# Patient Record
Sex: Male | Born: 1950 | Race: Black or African American | Hispanic: No | Smoking: Current every day smoker
Health system: Southern US, Community
[De-identification: ages and names within clinical notes are randomized; demographics above are authoritative.]

## PROBLEM LIST (undated history)

## (undated) DIAGNOSIS — I10 Essential (primary) hypertension: Secondary | ICD-10-CM

## (undated) DIAGNOSIS — K759 Inflammatory liver disease, unspecified: Secondary | ICD-10-CM

## (undated) DIAGNOSIS — M1990 Unspecified osteoarthritis, unspecified site: Secondary | ICD-10-CM

## (undated) DIAGNOSIS — E1140 Type 2 diabetes mellitus with diabetic neuropathy, unspecified: Secondary | ICD-10-CM

## (undated) DIAGNOSIS — N189 Chronic kidney disease, unspecified: Secondary | ICD-10-CM

## (undated) DIAGNOSIS — K861 Other chronic pancreatitis: Secondary | ICD-10-CM

## (undated) HISTORY — PX: FOOT AMPUTATION THROUGH METATARSAL: SHX644

## (undated) HISTORY — PX: CHOLECYSTECTOMY: SHX55

## (undated) HISTORY — PX: ABDOMINAL SURGERY: SHX537

## (undated) HISTORY — PX: PROSTATE BIOPSY: SHX241

---

## 1983-09-14 DIAGNOSIS — B182 Chronic viral hepatitis C: Secondary | ICD-10-CM

## 2000-03-23 ENCOUNTER — Emergency Department (HOSPITAL_COMMUNITY): Admission: EM | Admit: 2000-03-23 | Discharge: 2000-03-23 | Payer: Self-pay

## 2000-04-04 ENCOUNTER — Ambulatory Visit (HOSPITAL_COMMUNITY): Admission: RE | Admit: 2000-04-04 | Discharge: 2000-04-04 | Payer: Self-pay | Admitting: Family Medicine

## 2000-04-04 ENCOUNTER — Encounter: Payer: Self-pay | Admitting: Family Medicine

## 2001-02-27 ENCOUNTER — Emergency Department (HOSPITAL_COMMUNITY): Admission: EM | Admit: 2001-02-27 | Discharge: 2001-02-28 | Payer: Self-pay | Admitting: Emergency Medicine

## 2001-10-01 ENCOUNTER — Encounter: Payer: Self-pay | Admitting: Orthopedic Surgery

## 2001-10-01 ENCOUNTER — Ambulatory Visit (HOSPITAL_COMMUNITY): Admission: RE | Admit: 2001-10-01 | Discharge: 2001-10-01 | Payer: Self-pay | Admitting: Orthopedic Surgery

## 2001-11-01 ENCOUNTER — Emergency Department (HOSPITAL_COMMUNITY): Admission: EM | Admit: 2001-11-01 | Discharge: 2001-11-01 | Payer: Self-pay | Admitting: Emergency Medicine

## 2001-12-18 ENCOUNTER — Encounter: Payer: Self-pay | Admitting: Emergency Medicine

## 2001-12-18 ENCOUNTER — Inpatient Hospital Stay (HOSPITAL_COMMUNITY): Admission: EM | Admit: 2001-12-18 | Discharge: 2001-12-20 | Payer: Self-pay | Admitting: Emergency Medicine

## 2002-01-01 ENCOUNTER — Inpatient Hospital Stay (HOSPITAL_COMMUNITY): Admission: EM | Admit: 2002-01-01 | Discharge: 2002-01-05 | Payer: Self-pay | Admitting: *Deleted

## 2002-01-15 ENCOUNTER — Encounter: Admission: RE | Admit: 2002-01-15 | Discharge: 2002-04-15 | Payer: Self-pay | Admitting: *Deleted

## 2002-03-05 ENCOUNTER — Emergency Department (HOSPITAL_COMMUNITY): Admission: EM | Admit: 2002-03-05 | Discharge: 2002-03-05 | Payer: Self-pay | Admitting: Emergency Medicine

## 2002-03-06 ENCOUNTER — Inpatient Hospital Stay (HOSPITAL_COMMUNITY): Admission: EM | Admit: 2002-03-06 | Discharge: 2002-03-10 | Payer: Self-pay | Admitting: Emergency Medicine

## 2002-03-06 ENCOUNTER — Encounter (INDEPENDENT_AMBULATORY_CARE_PROVIDER_SITE_OTHER): Payer: Self-pay | Admitting: Specialist

## 2002-03-06 ENCOUNTER — Encounter: Payer: Self-pay | Admitting: Emergency Medicine

## 2002-03-07 ENCOUNTER — Encounter: Payer: Self-pay | Admitting: Internal Medicine

## 2002-04-23 ENCOUNTER — Inpatient Hospital Stay (HOSPITAL_COMMUNITY): Admission: EM | Admit: 2002-04-23 | Discharge: 2002-04-27 | Payer: Self-pay | Admitting: Emergency Medicine

## 2002-04-23 ENCOUNTER — Encounter: Payer: Self-pay | Admitting: Emergency Medicine

## 2002-04-24 ENCOUNTER — Encounter: Payer: Self-pay | Admitting: Internal Medicine

## 2002-05-14 ENCOUNTER — Emergency Department (HOSPITAL_COMMUNITY): Admission: EM | Admit: 2002-05-14 | Discharge: 2002-05-15 | Payer: Self-pay | Admitting: Emergency Medicine

## 2002-05-31 ENCOUNTER — Inpatient Hospital Stay (HOSPITAL_COMMUNITY): Admission: EM | Admit: 2002-05-31 | Discharge: 2002-06-02 | Payer: Self-pay | Admitting: Emergency Medicine

## 2002-05-31 ENCOUNTER — Encounter: Payer: Self-pay | Admitting: Emergency Medicine

## 2002-06-08 ENCOUNTER — Emergency Department (HOSPITAL_COMMUNITY): Admission: EM | Admit: 2002-06-08 | Discharge: 2002-06-08 | Payer: Self-pay | Admitting: Emergency Medicine

## 2002-06-24 ENCOUNTER — Emergency Department (HOSPITAL_COMMUNITY): Admission: EM | Admit: 2002-06-24 | Discharge: 2002-06-24 | Payer: Self-pay | Admitting: Emergency Medicine

## 2002-07-20 ENCOUNTER — Inpatient Hospital Stay (HOSPITAL_COMMUNITY): Admission: EM | Admit: 2002-07-20 | Discharge: 2002-07-23 | Payer: Self-pay | Admitting: Emergency Medicine

## 2002-07-20 ENCOUNTER — Encounter: Payer: Self-pay | Admitting: Emergency Medicine

## 2002-07-21 ENCOUNTER — Encounter: Payer: Self-pay | Admitting: Internal Medicine

## 2002-09-11 ENCOUNTER — Emergency Department (HOSPITAL_COMMUNITY): Admission: EM | Admit: 2002-09-11 | Discharge: 2002-09-11 | Payer: Self-pay | Admitting: Emergency Medicine

## 2002-09-13 DIAGNOSIS — Z8719 Personal history of other diseases of the digestive system: Secondary | ICD-10-CM | POA: Insufficient documentation

## 2003-06-28 ENCOUNTER — Ambulatory Visit (HOSPITAL_COMMUNITY): Admission: RE | Admit: 2003-06-28 | Discharge: 2003-06-28 | Payer: Self-pay | Admitting: *Deleted

## 2003-06-28 ENCOUNTER — Encounter (INDEPENDENT_AMBULATORY_CARE_PROVIDER_SITE_OTHER): Payer: Self-pay | Admitting: *Deleted

## 2003-12-17 ENCOUNTER — Emergency Department (HOSPITAL_COMMUNITY): Admission: EM | Admit: 2003-12-17 | Discharge: 2003-12-18 | Payer: Self-pay | Admitting: Emergency Medicine

## 2004-08-30 ENCOUNTER — Emergency Department (HOSPITAL_COMMUNITY): Admission: EM | Admit: 2004-08-30 | Discharge: 2004-08-30 | Payer: Self-pay | Admitting: Emergency Medicine

## 2004-11-16 ENCOUNTER — Emergency Department (HOSPITAL_COMMUNITY): Admission: EM | Admit: 2004-11-16 | Discharge: 2004-11-16 | Payer: Self-pay | Admitting: Emergency Medicine

## 2005-04-10 ENCOUNTER — Emergency Department (HOSPITAL_COMMUNITY): Admission: EM | Admit: 2005-04-10 | Discharge: 2005-04-10 | Payer: Self-pay | Admitting: Emergency Medicine

## 2005-04-12 ENCOUNTER — Emergency Department (HOSPITAL_COMMUNITY): Admission: EM | Admit: 2005-04-12 | Discharge: 2005-04-12 | Payer: Self-pay | Admitting: Family Medicine

## 2006-05-07 ENCOUNTER — Emergency Department (HOSPITAL_COMMUNITY): Admission: EM | Admit: 2006-05-07 | Discharge: 2006-05-07 | Payer: Self-pay | Admitting: Emergency Medicine

## 2006-05-09 ENCOUNTER — Emergency Department (HOSPITAL_COMMUNITY): Admission: EM | Admit: 2006-05-09 | Discharge: 2006-05-09 | Payer: Self-pay | Admitting: Emergency Medicine

## 2006-05-13 ENCOUNTER — Inpatient Hospital Stay (HOSPITAL_COMMUNITY): Admission: EM | Admit: 2006-05-13 | Discharge: 2006-05-25 | Payer: Self-pay | Admitting: Emergency Medicine

## 2006-05-14 ENCOUNTER — Encounter: Payer: Self-pay | Admitting: Vascular Surgery

## 2006-06-30 ENCOUNTER — Ambulatory Visit: Payer: Self-pay | Admitting: Internal Medicine

## 2006-07-14 ENCOUNTER — Ambulatory Visit: Payer: Self-pay | Admitting: Internal Medicine

## 2006-09-20 ENCOUNTER — Ambulatory Visit: Payer: Self-pay | Admitting: Internal Medicine

## 2006-12-20 ENCOUNTER — Ambulatory Visit: Payer: Self-pay | Admitting: Internal Medicine

## 2006-12-30 ENCOUNTER — Ambulatory Visit: Payer: Self-pay | Admitting: Internal Medicine

## 2007-04-27 ENCOUNTER — Ambulatory Visit: Payer: Self-pay | Admitting: Family Medicine

## 2007-05-01 DIAGNOSIS — I10 Essential (primary) hypertension: Secondary | ICD-10-CM | POA: Insufficient documentation

## 2007-05-01 DIAGNOSIS — E119 Type 2 diabetes mellitus without complications: Secondary | ICD-10-CM

## 2007-06-23 ENCOUNTER — Ambulatory Visit: Payer: Self-pay | Admitting: Internal Medicine

## 2007-06-23 DIAGNOSIS — L0201 Cutaneous abscess of face: Secondary | ICD-10-CM | POA: Insufficient documentation

## 2007-06-23 DIAGNOSIS — J309 Allergic rhinitis, unspecified: Secondary | ICD-10-CM | POA: Insufficient documentation

## 2007-06-23 DIAGNOSIS — L03211 Cellulitis of face: Secondary | ICD-10-CM

## 2007-06-23 DIAGNOSIS — J019 Acute sinusitis, unspecified: Secondary | ICD-10-CM

## 2007-06-23 LAB — CONVERTED CEMR LAB: Blood Glucose, Fingerstick: 93

## 2007-07-31 IMAGING — CR DG FOOT COMPLETE 3+V*L*
3 series · 3 of 3 positions shown · non-contrast
Comparison: 05/13/06.

CLINICAL DATA: Foot ulcer ? post amputation of 5th toe.
LEFT FOOT ? 3 VIEW:

[view not recorded (1 of 3)]
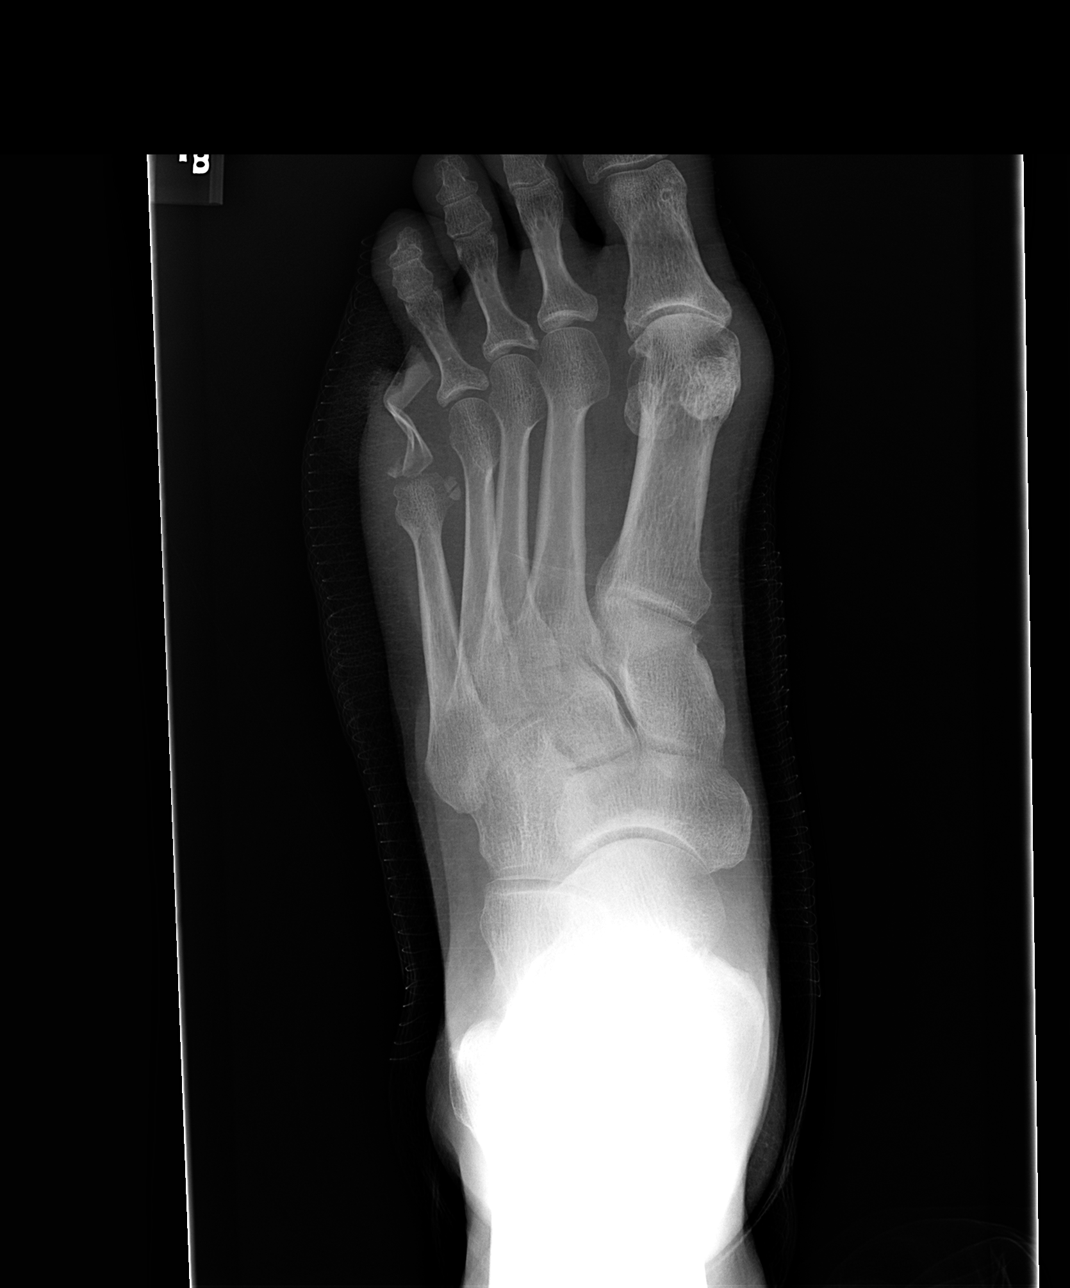

[view not recorded (2 of 3)]
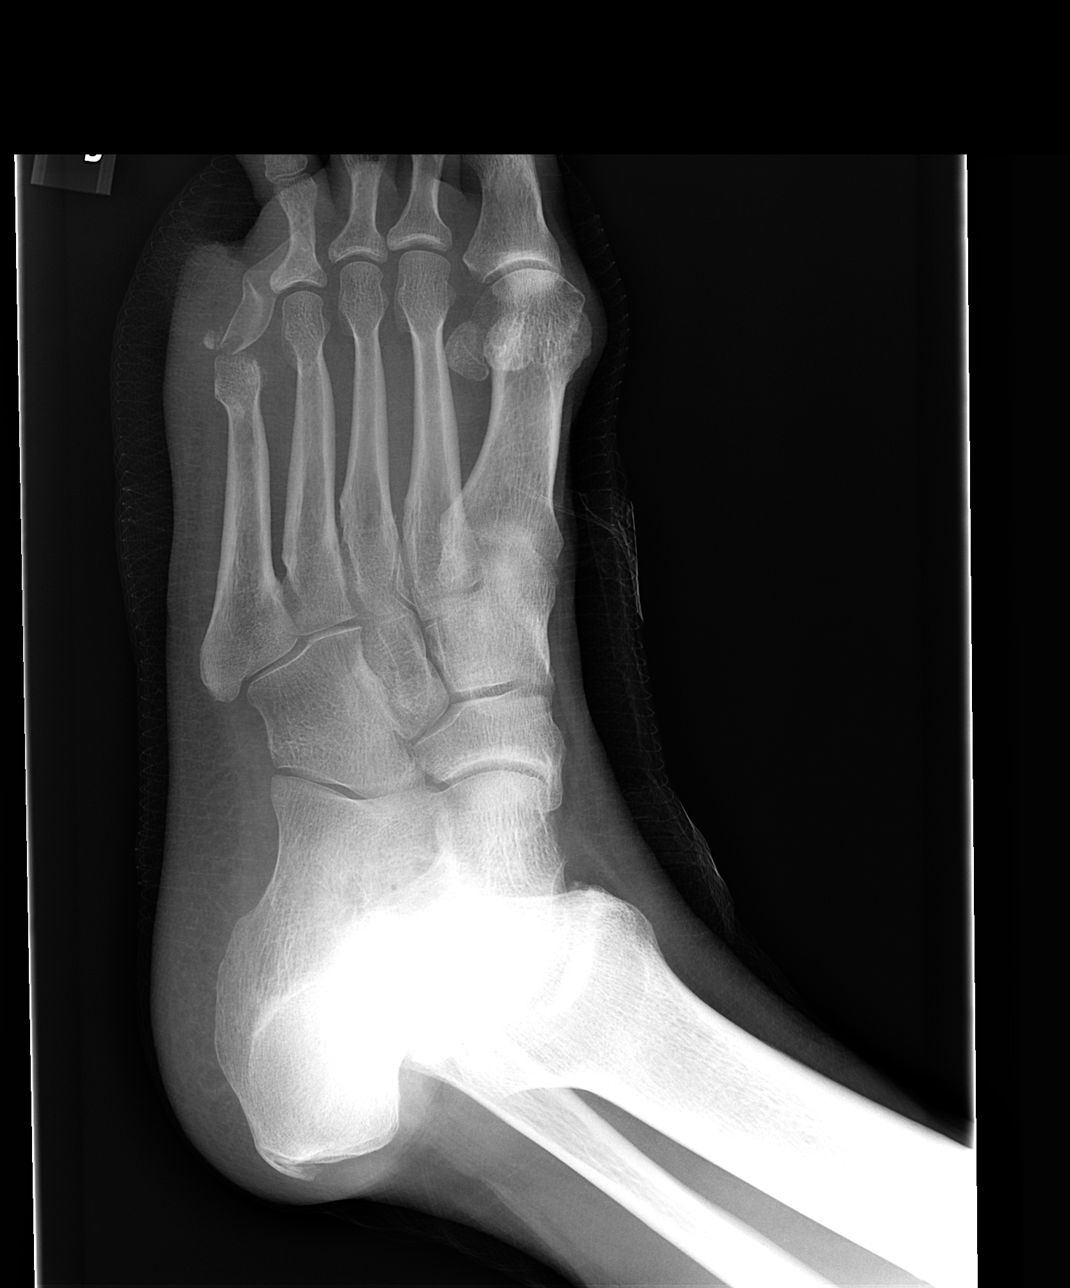

[view not recorded (3 of 3)]
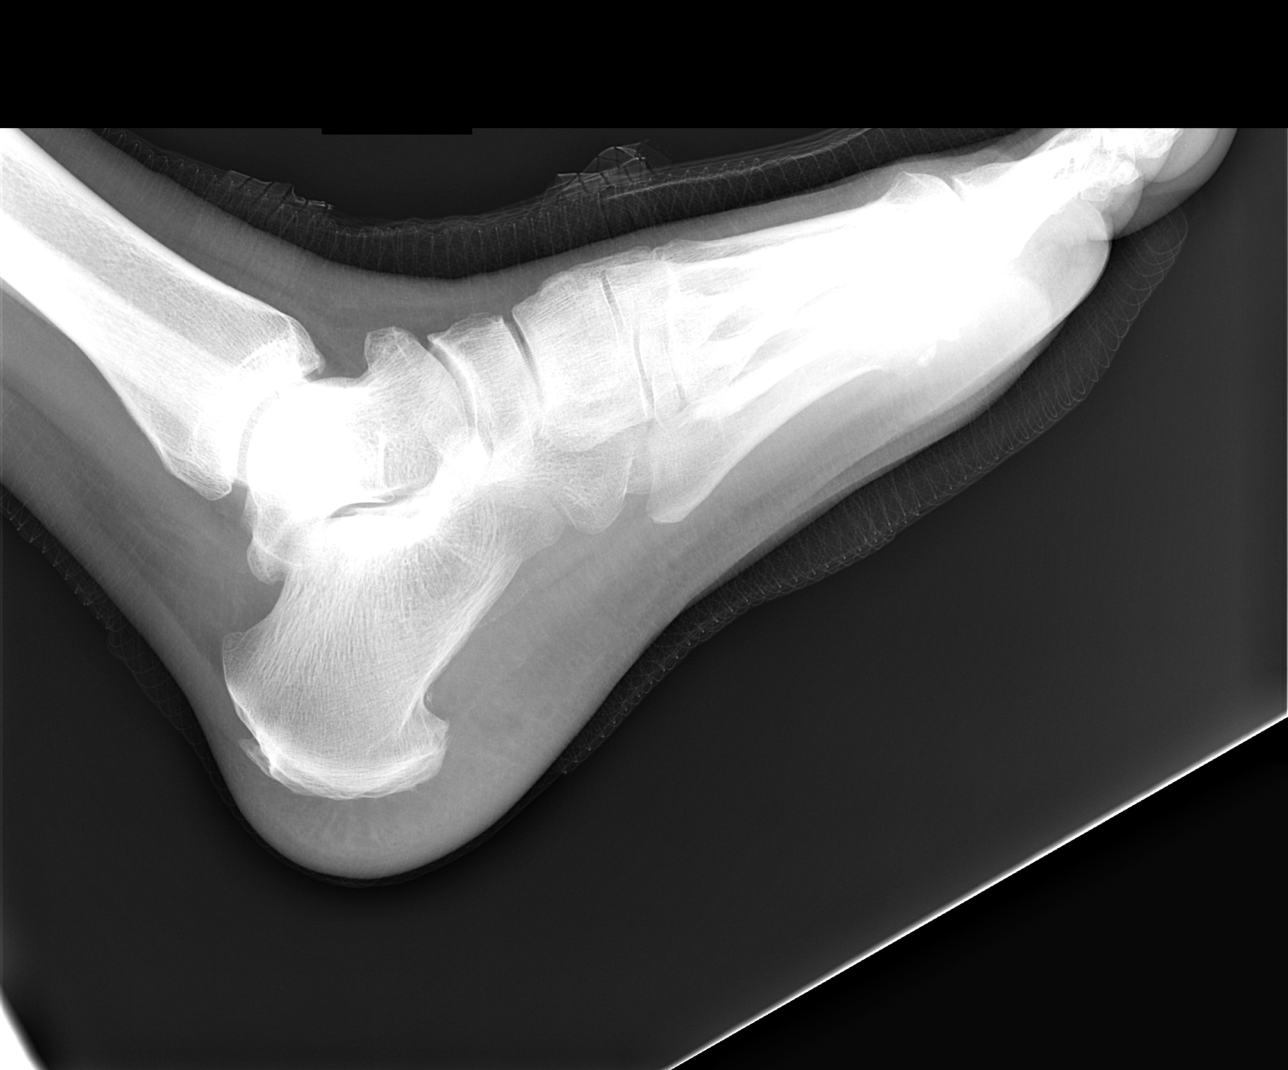

[3 of 3 positions shown; findings below may reference images not displayed]

FINDINGS: Status post amputation of the 5th toe.  There is a surgical drain at the site of the amputation.  No gas in the soft tissues.  No bony abnormality.  It appears as if a small piece of the lateral base of the proximal phalanx is still present.  The remainder of the toe has been amputated.
IMPRESSION: Status post left 5th toe amputation ? it appears as if a small piece of the lateral base of the phalanx is still in place.

## 2007-10-20 ENCOUNTER — Ambulatory Visit: Payer: Self-pay | Admitting: Internal Medicine

## 2007-10-20 DIAGNOSIS — B353 Tinea pedis: Secondary | ICD-10-CM

## 2007-10-26 ENCOUNTER — Telehealth (INDEPENDENT_AMBULATORY_CARE_PROVIDER_SITE_OTHER): Payer: Self-pay | Admitting: Internal Medicine

## 2007-11-06 ENCOUNTER — Ambulatory Visit: Payer: Self-pay | Admitting: *Deleted

## 2007-12-05 ENCOUNTER — Ambulatory Visit: Payer: Self-pay | Admitting: Internal Medicine

## 2007-12-05 DIAGNOSIS — L0293 Carbuncle, unspecified: Secondary | ICD-10-CM

## 2007-12-05 DIAGNOSIS — L0292 Furuncle, unspecified: Secondary | ICD-10-CM | POA: Insufficient documentation

## 2007-12-05 LAB — CONVERTED CEMR LAB
ALT: 36 units/L (ref 0–53)
AST: 39 units/L — ABNORMAL HIGH (ref 0–37)
Albumin: 5 g/dL (ref 3.5–5.2)
Alkaline Phosphatase: 91 units/L (ref 39–117)
BUN: 11 mg/dL (ref 6–23)
Basophils Absolute: 0 10*3/uL (ref 0.0–0.1)
Basophils Relative: 0 % (ref 0–1)
Bilirubin Urine: NEGATIVE
Blood in Urine, dipstick: NEGATIVE
CO2: 28 meq/L (ref 19–32)
Calcium: 9.9 mg/dL (ref 8.4–10.5)
Chloride: 103 meq/L (ref 96–112)
Creatinine, Ser: 1.16 mg/dL (ref 0.40–1.50)
Eosinophils Absolute: 0 10*3/uL (ref 0.0–0.7)
Eosinophils Relative: 1 % (ref 0–5)
Glucose, Bld: 82 mg/dL (ref 70–99)
Glucose, Urine, Semiquant: NEGATIVE
HCT: 43.8 % (ref 39.0–52.0)
Hemoglobin: 14.5 g/dL (ref 13.0–17.0)
Lymphocytes Relative: 38 % (ref 12–46)
Lymphs Abs: 2.3 10*3/uL (ref 0.7–4.0)
MCHC: 33.1 g/dL (ref 30.0–36.0)
MCV: 88.5 fL (ref 78.0–100.0)
Monocytes Absolute: 0.5 10*3/uL (ref 0.1–1.0)
Monocytes Relative: 8 % (ref 3–12)
Neutro Abs: 3.3 10*3/uL (ref 1.7–7.7)
Neutrophils Relative %: 54 % (ref 43–77)
Platelets: 289 10*3/uL (ref 150–400)
Potassium: 4 meq/L (ref 3.5–5.3)
Protein, U semiquant: 30
RBC: 4.95 M/uL (ref 4.22–5.81)
RDW: 13.3 % (ref 11.5–15.5)
Sodium: 144 meq/L (ref 135–145)
Total Bilirubin: 0.5 mg/dL (ref 0.3–1.2)
Total Protein: 8.1 g/dL (ref 6.0–8.3)
WBC: 6.1 10*3/uL (ref 4.0–10.5)

## 2007-12-07 ENCOUNTER — Ambulatory Visit: Payer: Self-pay | Admitting: Internal Medicine

## 2007-12-07 LAB — CONVERTED CEMR LAB: Blood Glucose, Fingerstick: 107

## 2007-12-14 ENCOUNTER — Telehealth (INDEPENDENT_AMBULATORY_CARE_PROVIDER_SITE_OTHER): Payer: Self-pay | Admitting: Internal Medicine

## 2008-07-11 ENCOUNTER — Ambulatory Visit: Payer: Self-pay | Admitting: Internal Medicine

## 2008-07-11 LAB — CONVERTED CEMR LAB: Blood Glucose, Fingerstick: 141

## 2008-07-26 ENCOUNTER — Ambulatory Visit: Payer: Self-pay | Admitting: Internal Medicine

## 2008-07-26 LAB — CONVERTED CEMR LAB
Protein, U semiquant: NEGATIVE
Urobilinogen, UA: 0.2
WBC Urine, dipstick: NEGATIVE

## 2008-07-31 ENCOUNTER — Encounter (INDEPENDENT_AMBULATORY_CARE_PROVIDER_SITE_OTHER): Payer: Self-pay | Admitting: Internal Medicine

## 2008-07-31 LAB — CONVERTED CEMR LAB
ALT: 62 units/L — ABNORMAL HIGH (ref 0–53)
AST: 42 units/L — ABNORMAL HIGH (ref 0–37)
Albumin: 4.7 g/dL (ref 3.5–5.2)
Alkaline Phosphatase: 75 units/L (ref 39–117)
Basophils Absolute: 0 10*3/uL (ref 0.0–0.1)
Basophils Relative: 0 % (ref 0–1)
Eosinophils Absolute: 0.2 10*3/uL (ref 0.0–0.7)
Eosinophils Relative: 3 % (ref 0–5)
Glucose, Bld: 87 mg/dL (ref 70–99)
HCT: 42.8 % (ref 39.0–52.0)
LDL Cholesterol: 102 mg/dL — ABNORMAL HIGH (ref 0–99)
Lymphs Abs: 2.8 10*3/uL (ref 0.7–4.0)
MCHC: 32.5 g/dL (ref 30.0–36.0)
MCV: 89.7 fL (ref 78.0–100.0)
Neutrophils Relative %: 43 % (ref 43–77)
PSA: 0.59 ng/mL (ref 0.10–4.00)
Platelets: 208 10*3/uL (ref 150–400)
Potassium: 4.5 meq/L (ref 3.5–5.3)
RDW: 13.9 % (ref 11.5–15.5)
Sodium: 143 meq/L (ref 135–145)
Total Bilirubin: 0.6 mg/dL (ref 0.3–1.2)
Total Protein: 7.1 g/dL (ref 6.0–8.3)
Triglycerides: 44 mg/dL (ref <150)
VLDL: 9 mg/dL (ref 0–40)
WBC: 6.1 10*3/uL (ref 4.0–10.5)

## 2008-08-28 ENCOUNTER — Telehealth (INDEPENDENT_AMBULATORY_CARE_PROVIDER_SITE_OTHER): Payer: Self-pay | Admitting: Internal Medicine

## 2008-08-28 LAB — CONVERTED CEMR LAB
Hep B C IgM: NEGATIVE
Hep B S Ab: NEGATIVE
Hepatitis B Surface Ag: NEGATIVE

## 2008-09-26 ENCOUNTER — Ambulatory Visit: Payer: Self-pay | Admitting: Internal Medicine

## 2008-09-26 DIAGNOSIS — K861 Other chronic pancreatitis: Secondary | ICD-10-CM | POA: Insufficient documentation

## 2008-09-26 DIAGNOSIS — E785 Hyperlipidemia, unspecified: Secondary | ICD-10-CM | POA: Insufficient documentation

## 2008-09-26 DIAGNOSIS — R809 Proteinuria, unspecified: Secondary | ICD-10-CM

## 2008-09-26 LAB — CONVERTED CEMR LAB
Bilirubin Urine: NEGATIVE
Blood in Urine, dipstick: NEGATIVE
Ketones, urine, test strip: NEGATIVE
Urobilinogen, UA: 1
pH: 7

## 2008-09-27 ENCOUNTER — Encounter (INDEPENDENT_AMBULATORY_CARE_PROVIDER_SITE_OTHER): Payer: Self-pay | Admitting: Internal Medicine

## 2008-10-03 LAB — CONVERTED CEMR LAB

## 2008-10-07 ENCOUNTER — Ambulatory Visit: Payer: Self-pay | Admitting: Internal Medicine

## 2008-10-08 ENCOUNTER — Encounter (INDEPENDENT_AMBULATORY_CARE_PROVIDER_SITE_OTHER): Payer: Self-pay | Admitting: Internal Medicine

## 2008-10-08 LAB — CONVERTED CEMR LAB: HCV Quantitative: 82400 intl units/mL — ABNORMAL HIGH (ref <43)

## 2008-10-15 ENCOUNTER — Encounter (INDEPENDENT_AMBULATORY_CARE_PROVIDER_SITE_OTHER): Payer: Self-pay | Admitting: Internal Medicine

## 2008-11-04 ENCOUNTER — Encounter (INDEPENDENT_AMBULATORY_CARE_PROVIDER_SITE_OTHER): Payer: Self-pay | Admitting: Internal Medicine

## 2008-11-29 ENCOUNTER — Encounter (INDEPENDENT_AMBULATORY_CARE_PROVIDER_SITE_OTHER): Payer: Self-pay | Admitting: Internal Medicine

## 2008-12-05 ENCOUNTER — Encounter (INDEPENDENT_AMBULATORY_CARE_PROVIDER_SITE_OTHER): Payer: Self-pay | Admitting: Internal Medicine

## 2008-12-05 ENCOUNTER — Ambulatory Visit: Payer: Self-pay | Admitting: Gastroenterology

## 2008-12-16 ENCOUNTER — Telehealth (INDEPENDENT_AMBULATORY_CARE_PROVIDER_SITE_OTHER): Payer: Self-pay | Admitting: Family Medicine

## 2008-12-20 ENCOUNTER — Encounter (INDEPENDENT_AMBULATORY_CARE_PROVIDER_SITE_OTHER): Payer: Self-pay | Admitting: Internal Medicine

## 2009-04-16 ENCOUNTER — Ambulatory Visit: Payer: Self-pay | Admitting: Internal Medicine

## 2009-04-16 DIAGNOSIS — R5381 Other malaise: Secondary | ICD-10-CM

## 2009-04-16 DIAGNOSIS — R5383 Other fatigue: Secondary | ICD-10-CM

## 2009-04-16 LAB — CONVERTED CEMR LAB
ALT: 70 units/L — ABNORMAL HIGH (ref 0–53)
AST: 47 units/L — ABNORMAL HIGH (ref 0–37)
Albumin: 4.8 g/dL (ref 3.5–5.2)
Alkaline Phosphatase: 73 units/L (ref 39–117)
Basophils Absolute: 0 10*3/uL (ref 0.0–0.1)
Hgb A1c MFr Bld: 6.5 %
Lymphocytes Relative: 42 % (ref 12–46)
Lymphs Abs: 2.6 10*3/uL (ref 0.7–4.0)
Neutro Abs: 3 10*3/uL (ref 1.7–7.7)
Platelets: 242 10*3/uL (ref 150–400)
Potassium: 4.1 meq/L (ref 3.5–5.3)
RDW: 14.5 % (ref 11.5–15.5)
Sodium: 145 meq/L (ref 135–145)
Total Protein: 7.7 g/dL (ref 6.0–8.3)
WBC: 6.1 10*3/uL (ref 4.0–10.5)

## 2009-04-30 ENCOUNTER — Ambulatory Visit: Payer: Self-pay | Admitting: Internal Medicine

## 2009-05-05 LAB — CONVERTED CEMR LAB
LDL Cholesterol: 123 mg/dL — ABNORMAL HIGH (ref 0–99)
Triglycerides: 56 mg/dL (ref <150)
VLDL: 11 mg/dL (ref 0–40)

## 2009-05-20 ENCOUNTER — Encounter (INDEPENDENT_AMBULATORY_CARE_PROVIDER_SITE_OTHER): Payer: Self-pay | Admitting: Internal Medicine

## 2009-05-21 ENCOUNTER — Ambulatory Visit (HOSPITAL_COMMUNITY): Admission: RE | Admit: 2009-05-21 | Discharge: 2009-05-21 | Payer: Self-pay | Admitting: Internal Medicine

## 2009-07-09 ENCOUNTER — Encounter (INDEPENDENT_AMBULATORY_CARE_PROVIDER_SITE_OTHER): Payer: Self-pay | Admitting: Internal Medicine

## 2009-08-19 ENCOUNTER — Ambulatory Visit: Payer: Self-pay | Admitting: Internal Medicine

## 2009-08-19 LAB — CONVERTED CEMR LAB
Blood Glucose, Fingerstick: 202
Hgb A1c MFr Bld: 8.1 %

## 2009-09-04 ENCOUNTER — Ambulatory Visit: Payer: Self-pay | Admitting: Internal Medicine

## 2009-09-04 ENCOUNTER — Telehealth (INDEPENDENT_AMBULATORY_CARE_PROVIDER_SITE_OTHER): Payer: Self-pay | Admitting: Internal Medicine

## 2009-09-16 DIAGNOSIS — E875 Hyperkalemia: Secondary | ICD-10-CM

## 2009-09-16 LAB — CONVERTED CEMR LAB
Calcium: 9.8 mg/dL (ref 8.4–10.5)
Glucose, Bld: 172 mg/dL — ABNORMAL HIGH (ref 70–99)
Sodium: 145 meq/L (ref 135–145)

## 2009-09-19 ENCOUNTER — Ambulatory Visit: Payer: Self-pay | Admitting: Internal Medicine

## 2009-09-19 LAB — CONVERTED CEMR LAB
BUN: 9 mg/dL
CO2: 24 meq/L
Calcium: 9.6 mg/dL
Chloride: 105 meq/L
Creatinine, Ser: 1.08 mg/dL
Glucose, Bld: 101 mg/dL — ABNORMAL HIGH
Potassium: 4 meq/L
Sodium: 144 meq/L

## 2009-10-01 ENCOUNTER — Ambulatory Visit: Payer: Self-pay | Admitting: Internal Medicine

## 2009-10-14 ENCOUNTER — Ambulatory Visit: Payer: Self-pay | Admitting: Internal Medicine

## 2009-10-14 DIAGNOSIS — M722 Plantar fascial fibromatosis: Secondary | ICD-10-CM | POA: Insufficient documentation

## 2009-10-14 LAB — CONVERTED CEMR LAB
ALT: 108 units/L — ABNORMAL HIGH (ref 0–53)
Blood Glucose, Fingerstick: 150
CO2: 29 meq/L (ref 19–32)
Calcium: 9.6 mg/dL (ref 8.4–10.5)
Chloride: 105 meq/L (ref 96–112)
Cholesterol: 165 mg/dL (ref 0–200)
Creatinine, Ser: 1.1 mg/dL (ref 0.40–1.50)
Total Protein: 7.5 g/dL (ref 6.0–8.3)

## 2009-11-11 ENCOUNTER — Ambulatory Visit: Payer: Self-pay | Admitting: Internal Medicine

## 2009-11-12 ENCOUNTER — Ambulatory Visit: Payer: Self-pay | Admitting: Internal Medicine

## 2009-11-12 DIAGNOSIS — Z8639 Personal history of other endocrine, nutritional and metabolic disease: Secondary | ICD-10-CM

## 2009-11-12 DIAGNOSIS — Z862 Personal history of diseases of the blood and blood-forming organs and certain disorders involving the immune mechanism: Secondary | ICD-10-CM

## 2009-11-12 LAB — CONVERTED CEMR LAB
Alkaline Phosphatase: 61 units/L (ref 39–117)
Indirect Bilirubin: 0.2 mg/dL (ref 0.0–0.9)
Total Bilirubin: 0.3 mg/dL (ref 0.3–1.2)

## 2009-11-26 ENCOUNTER — Encounter: Admission: RE | Admit: 2009-11-26 | Discharge: 2010-01-14 | Payer: Self-pay | Admitting: Internal Medicine

## 2009-12-01 ENCOUNTER — Encounter (INDEPENDENT_AMBULATORY_CARE_PROVIDER_SITE_OTHER): Payer: Self-pay | Admitting: Internal Medicine

## 2009-12-04 ENCOUNTER — Encounter (INDEPENDENT_AMBULATORY_CARE_PROVIDER_SITE_OTHER): Payer: Self-pay | Admitting: Internal Medicine

## 2009-12-17 ENCOUNTER — Ambulatory Visit: Payer: Self-pay | Admitting: Internal Medicine

## 2009-12-17 LAB — CONVERTED CEMR LAB
ALT: 68 units/L — ABNORMAL HIGH (ref 0–53)
Cholesterol: 186 mg/dL (ref 0–200)
Hgb A1c MFr Bld: 7.7 % — ABNORMAL HIGH (ref 4.6–6.1)
PSA: 0.55 ng/mL (ref 0.10–4.00)
Total CHOL/HDL Ratio: 4.7
Total Protein: 7.5 g/dL (ref 6.0–8.3)
Triglycerides: 85 mg/dL (ref <150)
VLDL: 17 mg/dL (ref 0–40)

## 2009-12-19 ENCOUNTER — Ambulatory Visit: Payer: Self-pay | Admitting: Internal Medicine

## 2009-12-19 LAB — CONVERTED CEMR LAB
Bilirubin Urine: NEGATIVE
Specific Gravity, Urine: >=1.03
Urobilinogen, UA: 1
WBC Urine, dipstick: NEGATIVE

## 2009-12-22 ENCOUNTER — Ambulatory Visit: Payer: Self-pay | Admitting: Internal Medicine

## 2009-12-27 ENCOUNTER — Encounter (INDEPENDENT_AMBULATORY_CARE_PROVIDER_SITE_OTHER): Payer: Self-pay | Admitting: Internal Medicine

## 2010-01-14 ENCOUNTER — Encounter (INDEPENDENT_AMBULATORY_CARE_PROVIDER_SITE_OTHER): Payer: Self-pay | Admitting: Internal Medicine

## 2010-02-16 ENCOUNTER — Ambulatory Visit: Payer: Self-pay | Admitting: Internal Medicine

## 2010-02-16 LAB — CONVERTED CEMR LAB
ALT: 80 units/L — ABNORMAL HIGH (ref 0–53)
Albumin: 4.9 g/dL (ref 3.5–5.2)
Alkaline Phosphatase: 68 units/L (ref 39–117)
Cholesterol: 149 mg/dL (ref 0–200)
HDL: 46 mg/dL (ref >39)
LDL Cholesterol: 86 mg/dL (ref 0–99)
Total Protein: 7.7 g/dL (ref 6.0–8.3)
Triglycerides: 85 mg/dL (ref <150)

## 2010-03-03 ENCOUNTER — Encounter (INDEPENDENT_AMBULATORY_CARE_PROVIDER_SITE_OTHER): Payer: Self-pay | Admitting: Internal Medicine

## 2010-04-21 ENCOUNTER — Ambulatory Visit: Payer: Self-pay | Admitting: Internal Medicine

## 2010-04-21 DIAGNOSIS — K029 Dental caries, unspecified: Secondary | ICD-10-CM | POA: Insufficient documentation

## 2010-07-22 ENCOUNTER — Ambulatory Visit: Payer: Self-pay | Admitting: Internal Medicine

## 2010-07-22 LAB — CONVERTED CEMR LAB
ALT: 96 units/L — ABNORMAL HIGH (ref 0–53)
AST: 67 units/L — ABNORMAL HIGH (ref 0–37)
Alkaline Phosphatase: 70 units/L (ref 39–117)
Blood Glucose, Fingerstick: 165
Indirect Bilirubin: 0.4 mg/dL (ref 0.0–0.9)
Total Protein: 7.6 g/dL (ref 6.0–8.3)

## 2010-08-10 ENCOUNTER — Telehealth (INDEPENDENT_AMBULATORY_CARE_PROVIDER_SITE_OTHER): Payer: Self-pay | Admitting: Internal Medicine

## 2010-08-21 ENCOUNTER — Ambulatory Visit: Payer: Self-pay | Admitting: Internal Medicine

## 2010-08-21 ENCOUNTER — Encounter (INDEPENDENT_AMBULATORY_CARE_PROVIDER_SITE_OTHER): Payer: Self-pay | Admitting: Internal Medicine

## 2010-08-21 DIAGNOSIS — R51 Headache: Secondary | ICD-10-CM

## 2010-08-21 DIAGNOSIS — R519 Headache, unspecified: Secondary | ICD-10-CM | POA: Insufficient documentation

## 2010-08-21 LAB — CONVERTED CEMR LAB: Sed Rate: 1 mm/hr (ref 0–16)

## 2010-09-08 ENCOUNTER — Encounter
Admission: RE | Admit: 2010-09-08 | Discharge: 2010-09-14 | Payer: Self-pay | Source: Home / Self Care | Attending: Internal Medicine | Admitting: Internal Medicine

## 2010-09-14 ENCOUNTER — Encounter (INDEPENDENT_AMBULATORY_CARE_PROVIDER_SITE_OTHER): Payer: Self-pay | Admitting: Internal Medicine

## 2010-09-15 ENCOUNTER — Encounter (INDEPENDENT_AMBULATORY_CARE_PROVIDER_SITE_OTHER): Payer: Self-pay | Admitting: Internal Medicine

## 2010-10-05 ENCOUNTER — Encounter: Payer: Self-pay | Admitting: Internal Medicine

## 2010-10-13 NOTE — Progress Notes (Signed)
  Phone Note Outgoing Call   Summary of Call: Please check with pt. as to whether he is taking his Metformin and whether he is eating better for his diabetes.  His sugars are not well controlled.  Let him know his liver enzymes are okay Initial call taken by: Julieanne Manson MD,  August 10, 2010 9:44 AM  Follow-up for Phone Call        pt says he is taking metformin... he says he did run out since we was closed for the holidays but he is going to pick up the med now.... pt says he is not eating right.... i advised pt to eat well and execrise Follow-up by: [place] Shannon,  August 10, 2010 11:11 AM  Additional Follow-up for Phone Call Additional follow up Details #1::        Has appt. 12/9--will address again then. Additional Follow-up by: Julieanne Manson MD,  August 11, 2010 3:25 PM

## 2010-10-13 NOTE — Letter (Signed)
Summary: *HSN Results Follow up  [facility]  [address] [place], [place] [address]   Phone: [phone]  Fax: [phone]      12/27/2009   Colton Garcia [address] [place], [place]  [address]   Dear  Mr. Colton Garcia,                            ____S.Drinkard,FNP   ____D. Gore,FNP       ____B. McPherson,MD   ____V. Rankins,MD    ___X_E. Hearl Heikes,MD    ____N. Daphine Deutscher, FNP  ____D. Reche Dixon, MD    ____K. Philipp Deputy, MD    ____Other     This letter is to inform you that your recent test(s):  _______Pap Smear    ___X____Lab Test     _______X-ray    ___X____ is within acceptable limits  _______ requires a medication change  _______ requires a follow-up lab visit  _______ requires a follow-up visit with your provider   Comments:  STD evaluation negative (everything was okay)       _________________________________________________________ If you have any questions, please contact our office                     Sincerely,  Julieanne Manson MD [facility]

## 2010-10-13 NOTE — Letter (Signed)
Summary: REFERRAL //PHYSICAL THERAPY  REFERRAL //PHYSICAL THERAPY   Imported By: Arta Bruce 01/08/2010 12:54:58  _____________________________________________________________________  External Attachment:    Type:   Image     Comment:   External Document

## 2010-10-13 NOTE — Progress Notes (Signed)
Summary: Office Visit//DEPRESSION SCREENING  Office Visit//DEPRESSION SCREENING   Imported By: Arta Bruce 02/20/2010 11:57:41  _____________________________________________________________________  External Attachment:    Type:   Image     Comment:   External Document

## 2010-10-13 NOTE — Letter (Signed)
Summary: [place] [facility]   Imported By: Arta Bruce 04/29/2010 15:34:16  _____________________________________________________________________  External Attachment:    Type:   Image     Comment:   External Document

## 2010-10-13 NOTE — Miscellaneous (Signed)
Summary: INITIAL SUMMARY  INITIAL SUMMARY   Imported By: Arta Bruce 02/10/2010 15:30:46  _____________________________________________________________________  External Attachment:    Type:   Image     Comment:   External Document

## 2010-10-13 NOTE — Letter (Signed)
Summary: Lipid Letter  [facility]  [address] [place], [place] [address]   Phone: [phone]  Fax: [phone]    03/03/2010  Colton Garcia [address] [place], [place]  [address]  Dear Colton Garcia:  We have carefully reviewed your last lipid profile from 02/16/2010 and the results are noted below with a summary of recommendations for lipid management.    Cholesterol:       149     Goal: <200   HDL "good" Cholesterol:   46     Goal: >45   LDL "bad" Cholesterol:   86     Goal: <70   Triglycerides:       85     Goal: <150    Your cholesterol is much better and other than just a bit high LDL, at goal.  Your liver enzymes are about the same--I say you stay on the low dose Crestor.  Please call in to make a follow up liver enzyme recheck in 3 months    TLC Diet (Therapeutic Lifestyle Change): Saturated Fats & Transfatty acids should be kept < 7% of total calories ***Reduce Saturated Fats Polyunstaurated Fat can be up to 10% of total calories Monounsaturated Fat Fat can be up to 20% of total calories Total Fat should be no greater than 25-35% of total calories Carbohydrates should be 50-60% of total calories Protein should be approximately 15% of total calories Fiber should be at least 20-30 grams a day ***Increased fiber may help lower LDL Total Cholesterol should be < 200mg /day Consider adding plant stanol/sterols to diet (example: Benacol spread) ***A higher intake of unsaturated fat may reduce Triglycerides and Increase HDL    Adjunctive Measures (may lower LIPIDS and reduce risk of Heart Attack) include: Aerobic Exercise (20-30 minutes 3-4 times a week) Limit Alcohol Consumption Weight Reduction Aspirin 75-81 mg a day by mouth (if not allergic or contraindicated) Dietary Fiber 20-30 grams a day by mouth     Current Medications: 1)    Metformin Hcl 500 Mg  Tabs (Metformin hcl) .... Take 1 tablet by mouth daily 2)    Crestor 5 Mg Tabs (Rosuvastatin  calcium) .Marland Kitchen.. 1 tab by mouth daily 3)    Lisinopril 20 Mg Tabs (Lisinopril) .Marland Kitchen.. 1 tab by mouth daily 4)    Hydrochlorothiazide 12.5 Mg Caps (Hydrochlorothiazide) .Marland Kitchen.. 1 cap by mouth daily in morning 5)    Bilateral Heel Cups  .... 728.71 6)    Cephalexin 500 Mg Caps (Cephalexin) .Marland Kitchen.. 1 cap by mouth 4 times daily for 5 days  If you have any questions, please call. We appreciate being able to work with you.   Sincerely,    [facility] Julieanne Manson MD

## 2010-10-13 NOTE — Miscellaneous (Signed)
Summary: Rehab Report/DISCHARGE SUMMARY  Rehab Report/DISCHARGE SUMMARY   Imported By: Arta Bruce 01/22/2010 11:54:02  _____________________________________________________________________  External Attachment:    Type:   Image     Comment:   External Document

## 2010-10-13 NOTE — Miscellaneous (Signed)
Summary: //RENEWAL SUMMARY  //RENEWAL SUMMARY   Imported By: Arta Bruce 02/23/2010 16:14:49  _____________________________________________________________________  External Attachment:    Type:   Image     Comment:   External Document

## 2010-10-13 NOTE — Assessment & Plan Note (Signed)
Summary: DM/ HTN/ FU WITH MULBERRY MD IN 4 MOS//GK   Vital Signs:  Patient profile:   60 year old male Weight:      176 pounds BMI:     25.34 Temp:     98.2 degrees F oral Pulse rate:   64 / minute Pulse rhythm:   regular Resp:     18 per minute BP sitting:   114 / 68  (left arm) Cuff size:   regular  Vitals Entered By: Hale Drone CMA (August 21, 2010 9:10 AM) CC: f/u on DM and HTN.  Is Patient Diabetic? Yes Pain Assessment Patient in pain? no      CBG Result 142 CBG Device ID A Non Fasting  Does patient need assistance? Functional Status Self care Ambulation Normal   CC:  f/u on DM and HTN. Marland Kitchen  History of Present Illness: 1.  DM:  Pt. states he was not watching his diet and was not exercising prior to last visit with A1C up to 8.5%.  He also went without Metformin for a period.  MOved from place he was staying, but now in another person's home who does not eat well also.  They will fix what he wants as long as he purchases the food.    2.  Headaches:  Top of head.  Pt. states was just lying around smoking a lot.  With onset of headaches, he decreased his smoking and became more active--has been working on this for a month, but no change to headaches.  Feels like pressure on top of his head.  Started in about this time in  November.  Occurring 2-3 times weekly.  No aura.  No definite photophobia or phonophobia.  No associated nausea or vomiting.  Sometimes so bad, feels like eyes will pop out.  Sometimes very short lived--comes on quickly and severely and if he sits and rests, will resolve in a few minutes.  Can last a couple of hours--if lies down and sleeps, resolved upon awakening.  Takes Aleve 2 tabs before lying down.  Not clear how much the med resolves the issue.  Under a lot of stress currently.  Mainly financial concerns.  Current Medications (verified): 1)  Metformin Hcl 500 Mg  Tabs (Metformin Hcl) .... Take 1 Tablet By Mouth Daily 2)  Crestor 5 Mg Tabs  (Rosuvastatin Calcium) .Marland Kitchen.. 1 Tab By Mouth Daily 3)  Lisinopril 20 Mg Tabs (Lisinopril) .Marland Kitchen.. 1 Tab By Mouth Daily 4)  Bilateral Heel Cups .... 728.71  Allergies (verified): No Known Drug Allergies  Physical Exam  General:  NAD Head:  NT over scalp. No thickening over temporal arteries Tender over left posterior neck musculature--states this also hurts with the headache Eyes:  No corneal or conjunctival inflammation noted. EOMI. Perrla. Funduscopic exam benign, without hemorrhages, exudates or papilledema. Vision grossly normal. Ears:  External ear exam shows no significant lesions or deformities.  Otoscopic examination reveals clear canals, tympanic membranes are intact bilaterally without bulging, retraction, inflammation or discharge. Hearing is grossly normal bilaterally. Nose:  External nasal examination shows no deformity or inflammation. Nasal mucosa are pink and moist without lesions or exudates. Mouth:  pharynx pink and moist.   Neck:  No deformities, masses, but tender over left paraspinous musculature Lungs:  Normal respiratory effort, chest expands symmetrically. Lungs are clear to auscultation, no crackles or wheezes. Heart:  Normal rate and regular rhythm. S1 and S2 normal without gallop, murmur, click, rub or other extra sounds. Neurologic:  alert &  oriented X3 and cranial nerves II-XII intact.    Diabetes Management Exam:    Foot Exam (with socks and/or shoes not present):       Sensory-Monofilament:          Left foot: normal          Right foot: normal   Impression & Recommendations:  Problem # 1:  HEADACHE (ICD-784.0)  Suspect tension related-check ESR, however  Orders: T-Sed Rate (Automated) (09381-82993) Physical Therapy Referral (PT)  Problem # 2:  LIVER FUNCTION TESTS, ABNORMAL, HX OF (ICD-V12.2) About the same on Crestor--recheck in another  months  Problem # 3:  DIABETES MELLITUS (ICD-250.00) Pt. to get back to better lifestyle--sounds like he is  trying. His updated medication list for this problem includes:    Metformin Hcl 500 Mg Tabs (Metformin hcl) .Marland Kitchen... Take 1 tablet by mouth daily    Lisinopril 20 Mg Tabs (Lisinopril) .Marland Kitchen... 1 tab by mouth daily  Complete Medication List: 1)  Metformin Hcl 500 Mg Tabs (Metformin hcl) .... Take 1 tablet by mouth daily 2)  Crestor 5 Mg Tabs (Rosuvastatin calcium) .Marland Kitchen.. 1 tab by mouth daily 3)  Lisinopril 20 Mg Tabs (Lisinopril) .Marland Kitchen.. 1 tab by mouth daily 4)  Bilateral Heel Cups  .... 728.71  Patient Instructions: 1)  labs for liver profile in 3 months 2)  Follow up with Dr. Delrae Alfred in 4 months --DM   Orders Added: 1)  T-Sed Rate (Automated) [71696-78938] 2)  Est. Patient Level IV [10175] 3)  Physical Therapy Referral [PT]     Diabetic Foot Exam Foot Inspection Is there a history of a foot ulcer?              No Is there a foot ulcer now?              No Can the patient see the bottom of their feet?          Yes Are the shoes appropriate in style and fit?          Yes Is there swelling or an abnormal foot shape?          No Are the toenails long?                No Are the toenails thick?                No Are the toenails ingrown?              Yes Is there heavy callous build-up?              No Is there pain in the calf muscle (Intermittent claudication) when walking?    NoIs there a claw toe deformity?              No Is there elevated skin temperature?            No Is there limited ankle dorsiflexion?            No Is there foot or ankle muscle weakness?            No  Diabetic Foot Care Education    10-g (5.07) Semmes-Weinstein Monofilament Test Performed by: Hale Drone CMA          Right Foot          Left Foot Visual Inspection               Test Control      normal  normal Site 1         normal         normal Site 2         normal         normal Site 3         normal         abnormal Site 4         normal         normal Site 5         normal          normal Site 6         normal         normal Site 7         normal         normal Site 8         normal         normal Site 9         normal         normal Site 10         normal         normal  Impression      normal         normal

## 2010-10-13 NOTE — Assessment & Plan Note (Signed)
Summary: FOLLOW UP WITH DR MULBERRY IN 4 MONTHS DM//GK   Vital Signs:  Patient profile:   60 year old male Weight:      172 pounds Temp:     98.0 degrees F Pulse rate:   56 / minute Pulse rhythm:   regular Resp:     16 per minute BP sitting:   110 / 72  (left arm) Cuff size:   regular  Vitals Entered By: Vesta Mixer CMA (April 21, 2010 9:11 AM) CC: 4 month f/u dm Is Patient Diabetic? Yes Pain Assessment Patient in pain? no      CBG Result 217  Does patient need assistance? Ambulation Normal   CC:  4 month f/u dm.  History of Present Illness: 1.  Hypertension:  Pt. not taking HCTZ--thought this was discontinued.  Has been good about taking Lisinopril regularly.    2.  DM:  Sugars fluctuating--can be 125 and other times above 250.  Has noted pressure type headache on days when sugar is high.  Can relate eating more carbs on the days he has higher blood sugars.  Has  been out of Metformin for 5 days.  Pharmacy stated they did not realize he needed that filled.   Not exercising much secondary to the heat.  3.   Hypercholesterolemia:  states roommate cooks a lot of fried foods and also carbs.  4.  Dental referral:  has not heard back--states over 1 year.  Not having an active problem currently.  Not particularly interested in dental hygiene care or cleaning.  Allergies (verified): No Known Drug Allergies  Physical Exam  Lungs:  Normal respiratory effort, chest expands symmetrically. Lungs are clear to auscultation, no crackles or wheezes. Heart:  Normal rate and regular rhythm. S1 and S2 normal without gallop, murmur, click, rub or other extra sounds.  Radial pulses normal and equal Abdomen:  Bowel sounds positive,abdomen soft and non-tender without masses, organomegaly or hernias noted. Extremities:  No clubbing, cyanosis, edema, or deformity noted with normal full range of motion of all joints.     Impression & Recommendations:  Problem # 1:  EPIDERMOID CYST,  INFECTED (ICD-706.2) Resolved infection  Problem # 2:  DYSLIPIDEMIA (ICD-272.4) To continue to work on diet. His updated medication list for this problem includes:    Crestor 5 Mg Tabs (Rosuvastatin calcium) .Marland Kitchen... 1 tab by mouth daily  Problem # 3:  HYPERTENSION (ICD-401.9) BP fine off HCTZ--stop The following medications were removed from the medication list:    Hydrochlorothiazide 12.5 Mg Caps (Hydrochlorothiazide) .Marland Kitchen... 1 cap by mouth daily in morning His updated medication list for this problem includes:    Lisinopril 20 Mg Tabs (Lisinopril) .Marland Kitchen... 1 tab by mouth daily  Problem # 4:  DIABETES MELLITUS (ICD-250.00) Control not as good. Pt. to speak with roommate and work on better diet. To increase exercise His updated medication list for this problem includes:    Metformin Hcl 500 Mg Tabs (Metformin hcl) .Marland Kitchen... Take 1 tablet by mouth daily    Lisinopril 20 Mg Tabs (Lisinopril) .Marland Kitchen... 1 tab by mouth daily  Orders: Capillary Blood Glucose/CBG (35573)  Problem # 5:  DENTAL CARIES (ICD-521.00) info given on free dental clinic and [employer] dental hygiene school services.  Complete Medication List: 1)  Metformin Hcl 500 Mg Tabs (Metformin hcl) .... Take 1 tablet by mouth daily 2)  Crestor 5 Mg Tabs (Rosuvastatin calcium) .Marland Kitchen.. 1 tab by mouth daily 3)  Lisinopril 20 Mg Tabs (Lisinopril) .Marland KitchenMarland KitchenMarland Kitchen 1  tab by mouth daily 4)  Bilateral Heel Cups  .... 728.71  Patient Instructions: 1)  Watch for free dental clinic beginning of November. 2)  Lab appt--nonfasting in 3 months for liver profile and A1C 3)  Follow up with Dr. Delrae Alfred in 4 months --DM, htn 4)  Stop HCTZ Prescriptions: CRESTOR 5 MG TABS (ROSUVASTATIN CALCIUM) 1 tab by mouth daily  #30 x 8   Entered and Authorized by:   Julieanne Manson MD   Signed by:   Julieanne Manson MD on 04/21/2010   Method used:   Faxed to ...       [facility] (retail)       [address] [place], [place]   [address]       Ph: [phone]       Fax: [phone]   RxID:   [id number]   Laboratory Results   Blood Tests     HGBA1C: 8.0%   (Normal Range: Non-Diabetic - 3-6%   Control Diabetic - 6-8%) CBG Random:: 217mg /dL     Appended Document: FOLLOW UP WITH DR MULBERRY IN 4 MONTHS DM//GK    Clinical Lists Changes  Observations: Added new observation of DIAB EYE EX: Retasure:  No retinopathy (12/22/2009 14:28)

## 2010-10-13 NOTE — Assessment & Plan Note (Signed)
Summary: RIGHT FOOT PAIN/SPOT ON FOOT//KT   Vital Signs:  Patient profile:   60 year old male Weight:      172 pounds Temp:     97.4 degrees F Pulse rate:   57 / minute Pulse rhythm:   regular Resp:     18 per minute Cuff size:   regular  Vitals Entered By: Vesta Mixer CMA (October 14, 2009 9:16 AM) CC: rt heel pain and spot on foot also Is Patient Diabetic? Yes Pain Assessment Patient in pain? yes     Location: rt heel Intensity: 2 CBG Result 150  Does patient need assistance? Ambulation Normal   CC:  rt heel pain and spot on foot also.  History of Present Illness: 1.  Right heel pain for 1 month:  No history of injury.  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  Wearing work boots generally, sometimes high top tennis shoes.  Does note some pain when first up in morning, but better after a couple of steps.  Has put gel soles in work boots.  Taking Aleve--2 tabs in morning only.    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 instructions To call if no improvement in 1-1 1/2 months  Complete Medication List: 1)  Metformin Hcl 500 Mg Tabs (Metformin hcl) .... Take 1 tablet by mouth daily 2)  Clotrimazole 1 % Ext Crea (Clotrimazole) .... Apply two times a day to affected areas 3)  Nasacort Aq 55 Mcg/act Aers (Triamcinolone acetonide(nasal)) .... 2 sprays each nostril daily 4)  Doxycycline Hyclate 100 Mg Tabs (Doxycycline hyclate) .Marland Kitchen.. 1 tab by mouth two times a day for 10 days 5)  Crestor 5 Mg Tabs (Rosuvastatin calcium) .Marland Kitchen.. 1 tab by mouth daily 6)  Lisinopril 20 Mg Tabs (Lisinopril) .Marland Kitchen.. 1 tab by mouth daily 7)  Hydrochlorothiazide 12.5 Mg Caps (Hydrochlorothiazide) .Marland Kitchen.. 1 cap by mouth daily in morning 8)  Bilateral Heel Cups  ....  728.71  Other Orders: Capillary Blood Glucose/CBG ([address])  Patient Instructions: 1)  liver profile in 1 month--lab visit-nonfasting for elevated liver enzymes 2)  FLP and liver profile, A1C, urine microalbumin, PSA at least 2 days before CPE in April--should already be scheduled.   3)  Aleve 2 tabs by mouth two times a day with food for 2 weeks 4)  Stretch your feet two times a day for 10-20 minutes 5)  Heel cups for both feet--use in all shoes 6)  Get thick cushioned slippers--do not walk around in stocking or bare feet. Prescriptions: BILATERAL HEEL CUPS 728.71  #2 x 0   Entered and Authorized by:   Julieanne Manson MD   Signed by:   Julieanne Manson MD on 10/14/2009   Method used:   Print then Give to Patient   RxID:   [id number]
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
## 2010-10-13 NOTE — Assessment & Plan Note (Signed)
Summary: right heel pain /tmm   Vital Signs:  Patient profile:   60 year old male Weight:      174.7 pounds BMI:     26.27 Temp:     98.0 degrees F oral Pulse rate:   68 / minute Pulse rhythm:   regular Resp:     19 per minute BP sitting:   162 / 85  (left arm) Cuff size:   regular  Vitals Entered By: Geanie Cooley  (November 12, 2009 10:30 AM) CC: Pt  is here for pain in his right heel. Pt states it hurts when he puts pressure on it and lift up. Pt states it hurts to walk on. Pt states they didnt have the bilaterial heel cups support, they gave him the closest thing to them , and pt states they have been working for about 2weeks, but the pain has started back up.  Is Patient Diabetic? Yes Did you bring your meter with you today? No Pain Assessment Patient in pain? yes     Location: foot Intensity: 7 Type: sharp Onset of pain  Constant CBG Result 96  Does patient need assistance? Functional Status Self care Ambulation Normal   CC:  Pt  is here for pain in his right heel. Pt states it hurts when he puts pressure on it and lift up. Pt states it hurts to walk on. Pt states they didnt have the bilaterial heel cups support, they gave him the closest thing to them , and pt states they have been working for about 2weeks, and but the pain has started back up. Marland Kitchen  History of Present Illness: 1.  Plantar fasciitis:  States when first got heel cups, was doing well.  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  Has not really been doing stretching exercises more than once a day and only for 5 minutes.  Is taking Aleve.  Calfs are now hurting as walking around on toes.  Denies walking barefoot.  2.  Elevated liver enzymes: Has Hep C, but started on Crestor recently with good results.  Liver enzymes last checked were a bit higher, but better yesterday on recheck.   Allergies (verified): No Known Drug Allergies  Physical Exam  Extremities:  Point tenderness  over plantar aspect of right heel.  No gastroc or soleus tenderness.  No achilles tendon tenderness  Diabetes Management Exam:    Foot Exam (with socks and/or shoes not present):       Sensory-Monofilament:          Left foot: normal          Right foot: normal   Impression & Recommendations:  Problem # 1:  LIVER FUNCTION TESTS, ABNORMAL, HX OF (ICD-V12.2) Improved--follow closely on Crestor Has CPE in April  Problem # 2:  PLANTAR FASCIITIS, RIGHT (ICD-728.71)  Orders: Physical Therapy Referral (PT)  Complete Medication List: 1)  Metformin Hcl 500 Mg Tabs (Metformin hcl) .... Take 1 tablet by mouth daily 2)  Clotrimazole 1 % Ext Crea (Clotrimazole) .... Apply two times a day to affected areas 3)  Nasacort Aq 55 Mcg/act Aers (Triamcinolone acetonide(nasal)) .... 2 sprays each nostril daily 4)  Doxycycline Hyclate 100 Mg Tabs (Doxycycline hyclate) .Marland Kitchen.. 1 tab by mouth two times a day for 10 days 5)  Crestor 5 Mg Tabs (Rosuvastatin calcium) .Marland Kitchen.. 1 tab by mouth daily 6)  Lisinopril 20 Mg Tabs (Lisinopril) .Marland Kitchen.. 1 tab by mouth daily 7)  Hydrochlorothiazide  12.5 Mg Caps (Hydrochlorothiazide) .Marland Kitchen.. 1 cap by mouth daily in morning 8)  Bilateral Heel Cups  .... 728.71  Other Orders: Capillary Blood Glucose/CBG (16109)   Vital Signs:  Patient profile:   60 year old male Weight:      174.7 pounds BMI:     26.27 Temp:     98.0 degrees F oral Pulse rate:   68 / minute Pulse rhythm:   regular Resp:     19 per minute BP sitting:   162 / 85  (left arm) Cuff size:   regular  Vitals Entered By: Geanie Cooley  (November 12, 2009 10:30 AM)    Last LDL:                                                 99 (10/01/2009 10:33:00 PM)      Diabetic Foot Exam Foot Inspection Is there a history of a foot ulcer?              No Is there a foot ulcer now?              No Can the patient see the bottom of their feet?          Yes Is there swelling or an abnormal foot shape?          No Are the  toenails long?                No Are the toenails thick?                Yes Are the toenails ingrown?              No Is there heavy callous build-up?              No Is there a claw toe deformity?                          No Is there elevated skin temperature?            No Is there limited ankle dorsiflexion?            No Is there foot or ankle muscle weakness?            No Do you have pain in calf while walking?           Yes         10-g (5.07) Semmes-Weinstein Monofilament Test           Right Foot          Left Foot Visual Inspection               Test Control      normal         normal Site 1         normal         normal Site 2         normal         normal Site 3         normal         normal Site 4         normal         normal Site 5         normal  normal Site 6         normal         normal Site 7         normal         normal Site 8         normal         normal Site 9         normal         normal Site 10         normal         normal  Impression      normal         normal   Laboratory Results   Blood Tests   Date/Time Received: November 12, 2009 10:52 AM   HGBA1C: 7.5%   (Normal Range: Non-Diabetic - 3-6%   Control Diabetic - 6-8%) CBG Random:: 96mg /dL

## 2010-10-13 NOTE — Letter (Signed)
Summary: general physical therapy referral form  general physical therapy referral form   Imported By: Arta Bruce 02/12/2010 11:26:02  _____________________________________________________________________  External Attachment:    Type:   Image     Comment:   External Document

## 2010-10-15 NOTE — Miscellaneous (Signed)
Summary: Rehab Report//INITIAL SUMMARY  Rehab Report//INITIAL SUMMARY   Imported By: Arta Bruce 09/15/2010 11:23:47  _____________________________________________________________________  External Attachment:    Type:   Image     Comment:   External Document

## 2010-10-15 NOTE — Miscellaneous (Signed)
Summary: Rehab Report//DISCHARGE SUMMARY  Rehab Report//DISCHARGE SUMMARY   Imported By: Arta Bruce 09/30/2010 11:09:45  _____________________________________________________________________  External Attachment:    Type:   Image     Comment:   External Document

## 2010-12-29 ENCOUNTER — Other Ambulatory Visit (HOSPITAL_COMMUNITY): Payer: Self-pay | Admitting: Internal Medicine

## 2010-12-29 DIAGNOSIS — B182 Chronic viral hepatitis C: Secondary | ICD-10-CM

## 2011-01-05 ENCOUNTER — Ambulatory Visit (HOSPITAL_COMMUNITY)
Admission: RE | Admit: 2011-01-05 | Discharge: 2011-01-05 | Disposition: A | Discharge status: Home / Self Care | Payer: Self-pay | Source: Ambulatory Visit | Attending: Internal Medicine | Admitting: Internal Medicine

## 2011-01-05 DIAGNOSIS — B1920 Unspecified viral hepatitis C without hepatic coma: Secondary | ICD-10-CM | POA: Insufficient documentation

## 2011-01-05 DIAGNOSIS — B182 Chronic viral hepatitis C: Secondary | ICD-10-CM

## 2011-01-11 ENCOUNTER — Emergency Department (HOSPITAL_COMMUNITY)
Admission: EM | Admit: 2011-01-11 | Discharge: 2011-01-11 | Disposition: A | Discharge status: Home / Self Care | Payer: Self-pay | Attending: Emergency Medicine | Admitting: Emergency Medicine

## 2011-01-11 ENCOUNTER — Emergency Department (HOSPITAL_COMMUNITY): Payer: Self-pay

## 2011-01-11 DIAGNOSIS — I10 Essential (primary) hypertension: Secondary | ICD-10-CM | POA: Insufficient documentation

## 2011-01-11 DIAGNOSIS — M79609 Pain in unspecified limb: Secondary | ICD-10-CM | POA: Insufficient documentation

## 2011-01-11 DIAGNOSIS — E119 Type 2 diabetes mellitus without complications: Secondary | ICD-10-CM | POA: Insufficient documentation

## 2011-01-11 DIAGNOSIS — M7989 Other specified soft tissue disorders: Secondary | ICD-10-CM | POA: Insufficient documentation

## 2011-01-11 DIAGNOSIS — L03039 Cellulitis of unspecified toe: Secondary | ICD-10-CM | POA: Insufficient documentation

## 2011-01-11 DIAGNOSIS — Z79899 Other long term (current) drug therapy: Secondary | ICD-10-CM | POA: Insufficient documentation

## 2011-01-29 NOTE — Procedures (Signed)
[place]. [facility]  Patient:    Colton Garcia, Colton Garcia Visit Number: [id number] MRN: [id number]          Service Type: MED Location: [phone] Attending Physician:  Dorrene German Dictated by:   Llana Aliment. Randa Evens, M.D. Proc. Date: 03/09/02 Admit Date:  03/06/2002 Discharge Date: 03/10/2002   CC:         Dr. Fleet Contras   Procedure Report  DATE OF BIRTH:  07/20/1951  PROCEDURE PERFORMED:  Colonoscopy and biopsy.  ENDOSCOPIST:  Llana Aliment. Randa Evens, M.D.  MEDICATIONS USED:  Fentanyl 100 mcg and Versed 10 mg IV.  INSTRUMENT:  Olympus pediatric adjustable colonoscope.  INDICATIONS:  The patient has had chronic diarrhea, had a CT scan which showed a thickened cecum.  He has also had bouts of relaxing pancreatitis.  Due to the abnormal CT, his bouts of abdominal pain, colonoscopy was performed.  DESCRIPTION OF PROCEDURE:  The procedure had been explained to the patient and consent obtained.  With the patient in the left lateral decubitus position, the Olympus pediatric video colonoscope was inserted and advanced under direct visualization.  The prep was excellent and we were able to advance to the cecum without difficulty.  The ileocecal valve was seen.  The terminal ileum was entered for approximately 10 cm and was endoscopically normal.  The scope was withdrawn.  The mucosa throughout the entire colon was completely normal with no ulceration, no colitis, no edema.  Multiple random biopsies were taken placed in a single jar.  There was no significant diverticular disease.  The cecum in particular was completely endoscopically normal.  Scope withdrawn, patient tolerated the procedure well.  Maintained on low flow oxygen and pulse oximeter.  ASSESSMENT:  Abnormal CT with colonoscopy revealing normal mucosa to the terminal ileum.  PLAN:  Will advance diet and if the patient does well, will go ahead and discharge.  I will also resume all of his  medicines. Dictated by:   Llana Aliment. Randa Evens, M.D. Attending Physician:  Dorrene German DD:  03/09/02 TD:  03/12/02 Job: [id number]

## 2011-01-29 NOTE — H&P (Signed)
[place]. [facility]  Patient:    Colton Garcia, Colton Garcia Visit Number: [id number] MRN: [id number]          Service Type: MED Location: [phone] Attending Physician:  Gaspar Garbe Dictated by:   Lester Kinsman, M.D. Admit Date:  12/18/2001 Discharge Date: 12/20/2001                           History and Physical  PRIMARY PHYSICIAN:  [facility], Dr. Gracelyn Nurse.  PATIENT IDENTIFICATION:  A 60 year old African-American male.  PROBLEM LIST: 1. Nausea, vomiting, abdominal pain consistent with pancreatitis. 2. History of chronic recurrent pancreatitis. 3. Status post cholecystectomy. 4. 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  HISTORY OF PRESENT ILLNESS:  Mr. Thunder is a 60 year old African-American male with a long history of recurrent chronic pancreatitis.  In fact, he was in the hospital three weeks ago for a mild exacerbation of his underlying pancreatitis.  He said his first episode was in 46.  He is not a drinker and does not have an etiology for this recurrent pancreatitis that I can ascertain from his history.  He says that at one time he did have gallstones but they were removed.  He also has had his gallbladder removed for that same purpose. He says around 10 p.m. this evening he developed nausea, vomiting, epigastric pain, and he came in for evaluation.  He reports no trauma, no fever, no shortness of breath, no chest pain, no melena, no hematochezia.  PAST MEDICAL HISTORY/SURGICAL HISTORY:  As per his problem list.  FAMILY HISTORY:  Noncontributory.  SOCIAL HISTORY:  He is divorced.  He lives with his girlfriend, has grown children.  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  He denies any alcohol use since 1970.  He does occasionally use marijuana.  He denies any IV drug use and no other medications.  CURRENT MEDICATIONS:  Include only Lodine.  ALLERGIES:  No drug allergies.  REVIEW  OF SYSTEMS:  As per HPI.  He reports no steatorrhea.  PHYSICAL EXAMINATION:  VITAL SIGNS:  Temperature 97.3, blood pressure 187/87, pulse 57, respiratory rate 24, O2 saturation 100%.  GENERAL:  No acute distress.  HEENT:  Oropharynx is clear.  Pupils are reactive.  EOMI.  NECK:  Supple, no lymphadenopathy.  CARDIOVASCULAR:  Regular rate and rhythm, no murmurs.  LUNGS:  Clear to auscultation bilaterally.  ABDOMEN:  Slightly tender over the epigastrium but he has active bowel sounds. He has no rebound or guarding noted.  EXTREMITIES:  No edema.  RECTAL:  Guaiac negative.  NEUROLOGIC:  Nonfocal.  LABORATORY DATA:  White count 13.2, hemoglobin 15, platelets 209.  Sodium 140, potassium 3.2, chloride 104, CO2 27, BUN 9, creatinine 1.0, glucose 191, total protein 7.6, albumin 4.1, AST 108, ALT 130, T bili 0.5, alk phos 93, amylase 324, lipase 54.  Abdominal CT was negative for any common bile duct dilatation and no pseudocysts.  ASSESSMENT AND PLAN:  Pancreatitis. 1. Pain control. 2. IV fluid hydration. 3. Bowel rest.  I am not exactly sure of the etiology of his recurrent chronic pancreatitis. No old charts are available for review.  On recheck, his blood pressure was 143/73.  Suspect that after receiving pain medications his blood pressure responded.  He will be placed on bowel rest, n.p.o. except for ice chips, and empiric coverage with Tequin - although I suspect this is a chronic problem. He  will get Demerol and Phenergan as needed, insulin 10 units regular one time now, and will follow up a CBC, CMET, lipid panel, amylase, lipase in the morning.  Will also check a hemoglobin A1c. Dictated by:   Lester Kinsman, M.D. Attending Physician:  Gaspar Garbe DD:  01/01/02 TD:  01/01/02 Job: [id number]

## 2011-01-29 NOTE — H&P (Signed)
NAMERONTAE, INGLETT              ACCOUNT NO.:  [id number]   MEDICAL RECORD NO.:  [id number]          PATIENT TYPE:  INP   LOCATION:  1823                         FACILITY:  [facility]   PHYSICIAN:  Corinna L. Lendell Caprice, MDDATE OF BIRTH:  09/30/1950   DATE OF ADMISSION:  05/13/2006  DATE OF DISCHARGE:                                HISTORY & PHYSICAL   CHIEF COMPLAINT:  Left foot pain.   HISTORY OF PRESENT ILLNESS:  Mr. Revoir is a pleasant 60 year old unassigned  black male with a history of diabetes who has been battling foot infection  for eight days now.  He noticed that his foot was swollen and painful eight  days ago. He went to the emergency room and had a prescription for an  antibiotic and was sent home on levofloxacin and told to follow up with Dr.  Concepcion Elk.  Unfortunately, the patient has not seen Dr. Concepcion Elk for many years  and has been dismissed from his practice. Nevertheless, this is his third  presentation to the emergency room since that time.Marland Kitchen  His foot has become  more painful.  It started draining foul smelling bloody/yellow fluid from  mainly the fifth toe.  He reports having a history of diabetes from chronic  pancreatitis but has not taken any medication for over a month.  It is  uncertain what medications he was taking.  Apparently he found a bottle that  was several years old in his house and started taking it again.   PAST MEDICAL HISTORY:  Chronic pancreatitis, diabetes   MEDICATIONS:  Levaquin and pain medication.   SOCIAL HISTORY:  He is a previous drinker.  He smokes marijuana. He smokes  one to two cigarettes a day.   FAMILY HISTORY:  His mother had diabetes.   REVIEW OF SYSTEMS:  As above, otherwise, negative.   PHYSICAL EXAMINATION:  VITAL SIGNS:  Temperature is 98.8, blood pressure 136/81, pulse 71,  respiratory rate 20, oxygen saturation 99% on room air.  GENERAL: The patient is well-nourished, well-developed in no acute distress.  HEENT:  Normocephalic, atraumatic.  Pupils equal, round, reactive to light.  Moist mucous membranes.  No thrush.  NECK:  Supple.  No lymphadenopathy.  LUNGS:  Clear to auscultation bilaterally without wheezes, rhonchi or rales.  CARDIOVASCULAR:  Regular rate and rhythm without murmurs, gallops or rubs.  ABDOMEN:  Normal bowel sounds, soft, nontender, nondistended.  GU AND RECTAL:  Deferred.  EXTREMITIES: No clubbing or cyanosis.  His left foot is very swollen and he  has an ulceration about the medial aspect of his fifth toe with some  serosanguineous drainage.  It is very tender.  Pulse intact.   LABORATORY DATA:  CBC within normal limits.  Basic metabolic panel  unremarkable.  X-ray of the foot shows a bunion, otherwise, negative.   ASSESSMENT/PLAN:  1. Diabetic foot ulcer:  I will check an MRI to rule out osteomyelitis.      He has failed fluoroquinolone.  I will start IV vancomycin and Zosyn.      Consider a orthopedics or podiatry consult. He will get pain medication  and be admitted to the floor.  2. Diabetes.  I will check a hemoglobin A1c and give sliding scale insulin      for now.  3. Chronic pancreatitis.      Corinna L. Lendell Caprice, MD  Electronically Signed     CLS/MEDQ  D:  05/13/2006  T:  05/14/2006  Job:  [id number]

## 2011-01-29 NOTE — H&P (Signed)
Colton Garcia, Colton Garcia                        ACCOUNT NO.:  [id number]   MEDICAL RECORD NO.:  [id number]                   PATIENT TYPE:  INP   LOCATION:  0455                                 FACILITY:  [facility]   PHYSICIAN:  Fleet Contras, M.D.                 DATE OF BIRTH:  03/14/1951   DATE OF ADMISSION:  07/20/2002  DATE OF DISCHARGE:                                HISTORY & PHYSICAL   PRESENTING COMPLAINTS:  1. Abdominal pain.  2. Nausea and vomiting.   HISTORY OF PRESENT ILLNESS:  Colton Garcia is a 61 year old African-American  gentleman with a history of recurrent acute pancreatitis. He was recently  admitted about a month ago at [facility] with similar symptoms. He  presented with a two day history of progressive nausea associated with  vomiting this morning prior to presenting to the emergency room. On  evaluation in the emergency room, he was noted to be continuously retching  and vomited once. He did not have any fevers or chills. He has no abdominal  distention, constipation or diarrhea. He denied any use of alcohol or  tobacco abuse. He admitted to using marijuana about two weeks ago at a  party. He denied any chest pain, shortness of breath, hematemesis or  hemoptysis. Initial laboratory studies in the emergency room showed elevated  AST, ALT and amylase. Lipase was normal. He was considered unstable for  discharge home and was therefore admitted to a medical bed.   PAST MEDICAL HISTORY:  1. Recurrent acute pancreatitis.  2. Type 2 diabetes mellitus diet controlled.  3. Gastroesophageal reflux disease.   PAST SURGICAL HISTORY:  None significant.   ALLERGIES:  No known drug allergies.   MEDICATIONS:  1. Protonix 40 mg q.d.  2. Pancrease 3 capsule p.o. t.i.d.  3. Reglan 10 mg p.o. t.i.d. a.c.  4. Mepergan 50/25 one p.o. 6h p.r.n.   FAMILY AND SOCIAL HISTORY:  He is single, lives alone, he is employed. He  denies any tobacco or alcohol abuse. Admits  using marijuana socially.   REVIEW OF SYMPTOMS:  CNS:  He has no slurring of speech, weakness of  extremities or headaches or dizziness. RESPIRATORY:  No cough, sputum  production, hemoptysis. CARDIOVASCULAR:  No chest pain, shortness of breath,  orthopnea or PND. GU:  No dysuria, frequency, or hematuria. MUSCULOSKELETAL:  No muscle pains, joint pains, or joint swellings.   PHYSICAL EXAMINATION:  GENERAL:  Colton Garcia is lying supine in bed. He does  not seem to be in acute respiratory or painful distress.  VITAL SIGNS:  Blood pressure 171/97, heart rate 86 regular, temperature  98.4, respiratory rate 22.  HEENT:  He is alert and oriented x3.  Pupils are equal and reactive to light  and accommodation. He is not pale, he is afebrile and he is not icteric. He  is not cyanotic. His neck is supple with no JVD. He  has moist oral mucosa.  CHEST:  Shows good air entry bilaterally with no added sounds.  HEART:  Sounds 1 and 2 are heard with no murmurs.  ABDOMEN:  Flat, soft. He is tender in the left upper quadrant and the  epigastrium. There is no guarding or rebound tenderness. There are no  palpable masses. Bowel sounds are present.  EXTREMITIES:  Shows no edema or ulcerations, no calf tenderness or swelling.   LABORATORY DATA:  CBC is essentially within normal limits. WBC count of  5.2, hemoglobin 13.7, hematocrit 40.5, platelet count of 183. Chemistry  shows sodium of 143, potassium 3.4, chloride 109, carbon dioxide 23, glucose  is 159, BUN 10, creatinine 1.0. Bilirubin is 0.7, alkaline phosphatase 68,  SGOT 129, SGPT 178. Serum protein is 7.2, albumin 4.2, calcium 9.8, amylase  is 244 with lipase of 40.   ASSESSMENT:  Colton Garcia is a 60 year old African-American gentleman with a  history of recurrent acute pancreatitis. He has been admitted via the  emergency room with symptoms similar to prior presentations. His only  possible precipitating factor for this episode might be the use  marijuana  about two weeks prior to this symptomatology. Due to the progressive nature  of his symptoms, he has been admitted for intravenous fluids, intravenous  analgesia and bowel rest.   ADMISSION DIAGNOSES:  1. Acute exacerbation of chronic pancreatitis.  2. Type 2 diabetes mellitus mildly uncontrolled.   PLAN OF CARE:  Patient will be ______________ with normal saline at 150  cc/hour. Analgesia was maintained Demerol 50 mg with Phenergan 4.5 mg q.4-  6h. p.r.n. CBGs will be monitored q.a.c. and h.s.  Vital signs q.4h.  Abdominal ultrasound will be performed to rule out gallstones which may not  have been evident in the past. Electrolytes will be monitored closely. The  patient will be monitored with the above plan and adjusted according to the  patient's response and the results of laboratory investigations.                                               Fleet Contras, M.D.    EA/MEDQ  D:  07/21/2002  T:  07/21/2002  Job:  [id number]
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
## 2011-01-29 NOTE — Discharge Summary (Signed)
NAMEKINCADE, GRANBERG                        ACCOUNT NO.:  [id number]   MEDICAL RECORD NO.:  [id number]                   PATIENT TYPE:  INP   LOCATION:  5013                                 FACILITY:  [facility]   PHYSICIAN:  Edwin A. Concepcion Elk, M.D.              DATE OF BIRTH:  09-30-1950   DATE OF ADMISSION:  04/23/2002  DATE OF DISCHARGE:  04/27/2002                                 DISCHARGE SUMMARY   CHIEF COMPLAINT:  1. Severe abdominal pain.  2. Nausea and vomiting.   ADMISSION DIAGNOSES:  1. Acute pancreatitis.  2. Hepatitis C viral infection.  3. Hypovolemia.  4. Gastroesophageal reflux disease.  5. Chronic pancreatitis.  6. Tobacco use disorder.  7. Uncontrolled diabetes mellitus type 2.   DISCHARGE DIAGNOSES:  1. Acute pancreatitis.  2. Hepatitis C viral infection.  3. Hypovolemia.  4. Gastroesophageal reflux disease.  5. Chronic pancreatitis.  6. Tobacco use disorder.  7. Uncontrolled diabetes mellitus type 2.   HISTORY OF PRESENT ILLNESS:  The patient is a 60 year old African-American  gentleman with a history of chronic pancreatitis with remote alcohol abuse.  He denies any current use of alcohol, but admits to smoking tobacco and  marijuana.  He presented to the emergency room with severe abdominal pain  with nausea and vomiting, unable to keep any foods or medications down.  He  admits to not being compliant with follow-up visits as well as prescribed  medications and he has history of positive hepatitis C antibody which has  not been treated.  In the emergency room, he was noted to be mildly  dehydrated and due to the severity of his symptoms, CPKs and troponins were  requested to rule out acute coronary syndrome and these were negative so  far.  Urine drug screen was positive for opiates and cannabis.   HOSPITAL COURSE:  The patient was admitted through the emergency room with  NG tube placement for persistent vomiting.  CT scan of the abdomen was  requested to rule out gastric outlet obstruction and this was reportedly  negative.  He was given intravenous fluids with D5 normal saline at 100 cc  an hour.  Intravenous Protonix 40 mg q.d., Demerol 50 mg q.6h., and  Phenergan 2.5 mg q.6h.  Serial CPKs, troponins, and EKGs did not show any  active coronary syndrome.  CT scan of the abdomen did not show any evidence  of acute pancreatitis.  He did have elevation of AST and ALT.  The AST was  121 and ALT 197.  Amylase was 211 with lipase of 30.  His symptoms improved  within 48 hours and his nasogastric tube was therefore discontinued.  He was  started on clear liquid diet and this was advanced as tolerated.  On April 27, 2002, the patient had no more nausea, vomiting, or abdominal pain.  Vital signs were stable, he was afebrile.  Abdomen was soft and  nontender.  He was considered stable for discharge home.   DISCHARGE MEDICATIONS:  1. Protonix 40 mg q.d.  2. Folic acid 1 mg q.d.  3. Thiamine 100 mg q.d.  4. Pancrease three capsules t.i.d.  5. Mepergan 50/25 one t.i.d. p.r.n.   DIET:  Low fat, 1800 kilocalorie diet.   DISCHARGE INSTRUCTIONS:  He is to avoid alcohol, tobacco, and marijuana.  He  is to follow up with Dorma Russell A. Avbuere, M.D. in two weeks at [phone].                                               Edwin A. Concepcion Elk, M.D.    EAA/MEDQ  D:  05/30/2002  T:  06/02/2002  Job:  [id number]
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
## 2011-01-29 NOTE — H&P (Signed)
[place]  Patient:    Colton Garcia, Colton Garcia Visit Number: [id number] MRN: [id number]          Service Type: EMS Location: ED Attending Physician:  Donnetta Hutching Dictated by:   Verlon Setting [name], M.D. Admit Date:  03/05/2002 Discharge Date: 03/05/2002                           History and Physical  DATE OF BIRTH: Nov 01, 1950  PRESENTING COMPLAINTS:  1. Severe abdominal pain.  2. Nausea and vomiting.  HISTORY OF PRESENT ILLNESS: The patient is a 60 year old African-American gentleman, who presented to the emergency room of [place] in the early hours of this morning with severe upper abdominal pain of about three days duration.  This was associated with nausea and vomiting and unable to keep food and drink down.  He had been seen at [facility] two days ago and was thought to have gastritis and sent home on H2 receptor antagonist and antiemetics.  His symptoms seemed to improve for one day but recurred in the early hours of this morning with profuse vomiting. His vomitus was mainly yellowish and nonbilious.  He denied any blood in the vomitus.  He did not have any diarrhea and had no blood in his stools or melenic stools.  He denied any fever or chills.  He has not had any alcohol use recently.  He smokes about one-third to one-half pack of cigarettes a day. On evaluation in the emergency room the patient was persistently retching and vomiting and in severe abdominal distress, and was therefore admitted for close monitoring and further investigations.  PAST MEDICAL HISTORY:  1. Chronic pancreatitis.  2. Gastroesophageal reflux disease.  3. Hepatitis C virus positive.  4. Type 2 diabetes mellitus, which is diet controlled.  PAST SURGICAL HISTORY: Cholecystectomy.  MEDICATIONS:  1. Pancrease, three pills p.o. t.i.d.  2. Phenergan 25 mg q.6h p.r.n.  3. Protonix 40 mg p.o. q.d.  4. Multivitamin one p.o.  q.d.  The patient states that he actually ran out of his Pancrease and Protonix at the end of last week and was waiting for his doctor visit this week for refills.  ALLERGIES: No known drug allergies.  SOCIAL HISTORY: He is divorced.  Currently he lives by himself.  He denies any recent alcohol abuse but smokes about one-third to one-half pack of cigarettes per day.  He denies any use of illicit drugs.  REVIEW OF SYSTEMS: GENERAL: He denies any fever, chills, sweating.  Does feel fatigued and weak.  RESPIRATORY: He had no cough, sputum production, hemoptysis, or wheezing.  CARDIOVASCULAR: He denies any chest pain, exertional dyspnea, orthopnea, PND, or peripheral edema.  GU: He denies any dysuria, frequency, or hematuria.  CNS: He has no dizziness, syncope, seizures, weakness of extremities, or slurred speech.  PHYSICAL EXAMINATION:  GENERAL: He is lying flat in bed, not in acute respiratory distress; in mild to moderate abdominal pain distress.  He is mildly dehydrated.  He is not icteric.  He is not febrile, and he is not cyanotic.  He is alert and oriented x3.  HEENT: PERRLA.  NECK: Supple with no elevated JVD, no cervical lymphadenopathy, and no carotid bruits.  VITAL SIGNS: His blood pressure in the emergency room was 191/93, now on the Floor is 143/76.  Heart rate is 98, regular.  Respiratory rate is 20. Temperature was 96.9 degrees.  CHEST:  Clear to auscultation, with no adventitious sounds.  CARDIAC: Heart sounds 1 and 2 are heard, with no murmurs.  ABDOMEN: Flat, soft, nontender.  No masses, no hepatosplenomegaly.  Bowel sounds are present.  EXTREMITIES: No edema or ulcerations.  He has no calf tenderness or swelling.  CNS: He is alert and oriented x3, with no focal neurological deficits.  LABORATORY DATA: Today sodium is 142, potassium 3.5, chloride 104, bicarbonate 27, BUN 8, creatinine 1.1, glucose 159.  WBC is 6.1, hemoglobin 15.2, hematocrit 44.8;  platelet count 191,000.  AST is 119, ALT 116.  Alkaline phosphatase 79, amylase 259, lipase 48.  X-ray of the abdomen in the emergency room revealed normal bowel gas pattern. Chest x-ray was clear without any acute infiltrates.  ASSESSMENT: The patient is a 60 year old African-American gentleman, with a history of chronic pancreatitis secondary to remote alcohol abuse.  He denies any current use of alcohol.  He had run out of his chronic pancreatitis medications toward the end of last weekend and he presented with symptoms of severe abdominal pain, vomiting, and unable to keep diet down over the weekend.  He has now been admitted.  DIAGNOSES:  1. Acute on chronic pancreatitis.  2. History of hepatitis C positive, with elevated transaminases.  3. Uncontrolled type 2 diabetes mellitus.  4. Gastroesophageal reflux disease.  PLAN OF CARE:  1. The patient will be monitored closely on a clear liquid diet.  2. Intravenous fluids.  3. Intravenous analgesics and antiemetic agents.  4. Intravenous Protonix 40 mg q.d.  5. CBGs q.a.c. and h.s.  6. Liver function tests will be monitored closely.  7. EKG will be performed in the a.m.  8. Further management decisions and investigations will be taken based on     his response to this plan of care and results of further testing.  This plan of care has been discussed with the patient and accepted by him, and all his questions have been answered. Dictated by:   Verlon Setting [name], M.D. Attending Physician:  Donnetta Hutching DD:  03/06/02 TD:  03/08/02 Job: [id number]

## 2011-01-29 NOTE — H&P (Signed)
NAMEMING, MCMANNIS                        ACCOUNT NO.:  [id number]   MEDICAL RECORD NO.:  [id number]                   PATIENT TYPE:  INP   LOCATION:  0375                                 FACILITY:  [facility]   PHYSICIAN:  Edwin A. Concepcion Elk, M.D.              DATE OF BIRTH:  14-Apr-1951   DATE OF ADMISSION:  05/31/2002  DATE OF DISCHARGE:                                HISTORY & PHYSICAL   PRESENTING COMPLAINTS:  1. Abdominal pain.  2. Nausea and vomiting.   HISTORY OF PRESENT ILLNESS:  The patient is a 60 year old African-American  gentleman with a past medical history of recurrent acute pancreatitis.  He  presented to the emergency room in the afternoon of May 31, 2002 with  three days' history of progressively worsening abdominal pain which is  periumbilical in location, nonradiating, and crampy in nature and denied any  fevers or chills.  He did have nausea and vomiting on the day of  presentation and the vomitus was mainly bilious without any blood.  He  denied any melena and apparently moved his bowels the morning of  presentation.  He had no chest pain, shortness of breath, hemoptysis.  He  denies any use of tobacco or alcohol.  He admitted smoking marijuana about  three days prior to presentation.  He also ran out of his Protonix about  four days prior to presentation and he thinks that that might be the reason  his hepatitis flared up.  He denied any use of aspirin or other nonsteroidal  anti-inflammatory agents.   PAST MEDICAL HISTORY:  1. Recurrent acute pancreatitis.  The patient was recently admitted in     August with similar presentation.  At that time, a CAT scan showed a     cecal mass and he had a colonoscopy which was negative for any colonic     lesion and his symptoms resolved on conservative management prior to his     discharge.  He is noncompliant with followup of his visits and states     that he could not afford to buy his medications.  2. Type 2  diabetes mellitus; this is diet controlled.  3. History of hepatitis C infection.   MEDICATIONS:  1. Protonix 40 mg q.d.  2. Pancrease 3 capsules, one t.i.d. a.c.  3. Mepergan 50/25 mg one q.6 h. p.r.n.  4. Folic acid 1 mg q.d.  5. Thiamine 100 mg q.d.   ALLERGIES:  No known drug allergies.   FAMILY HISTORY AND SOCIAL HISTORY:  He is employed.  Apparently lives with  girlfriend.  He smokes about one pack of cigarettes per 10 days.  Denies any  use of alcohol but admits using marijuana socially.   REVIEW OF SYSTEMS:  Essentially as above.   PHYSICAL EXAMINATION:  GENERAL:  He is alert and oriented x3.  HEENT:  Pupils equal and reactive to light and accommodation.  Head  is  atraumatic and normocephalic.  NECK:  Supple with no __________ JVD or cervical lymphadenopathy.  CHEST:  Clear to auscultation with no adventitious sounds.  ABDOMEN:  Flat, tender in the epigastrium and periumbilical region.  Has no  guarding or rebound tenderness.  Bowel sounds are present.  EXTREMITIES:  Shows no edema, ulcerations, or calf tenderness.  VITAL SIGNS:  In the emergency room, blood pressure 184/92, pulse 84,  respiratory rate 30, temperature 97.4.  On arrival to the unit, blood  pressure 138/81, heart rate 62, respiratory rate 18, temperature 97.4.   LABORATORY DATA:  Amylase is 396, which is elevated, lipase is 55 which is  mildly elevated, AST is 133, and ALT 203 and these are also elevated.  Alkaline phosphatase is normal at 88.  A CBC within normal limits.  CHEM-7  also is within normal limits with potassium of 3.8.   RADIOLOGICAL STUDIES:  Acute abdominal series, radiological studies, shows  chest x-ray with no acute infiltrates, and abdomen with mild constipation  and nonobstructive bowel gas pattern.   ASSESSMENT:  The patient is a 60 year old African American gentleman with  recurrent acute pancreatitis, who his noncompliance with followup office  visits and his medications,  presented to the emergency room with three days  of progressive abdominal pain and nausea and vomiting for one day.  He has  been admitted to the medical floor with elevated amylase, lipase and  transaminases presumably due to acute pancreatitis.   PLAN OF CARE:  He will be placed NPO.  He will receive intravenous fluids  and intravenous analgesic agents, as well as intravenous Protonix 40 mg q.d.  CBCs will be monitored q.6 hourly, vital signs q.4 hourly.  His plan of care  will be adjusted according to his response to these and the results of  further investigations.                                               Edwin A. Concepcion Elk, M.D.    EAA/MEDQ  D:  06/01/2002  T:  06/01/2002  Job:  [id number]

## 2011-01-29 NOTE — Discharge Summary (Signed)
   Colton Garcia, Colton Garcia                        ACCOUNT NO.:  [id number]   MEDICAL RECORD NO.:  [id number]                   PATIENT TYPE:  INP   LOCATION:  0455                                 FACILITY:  [facility]   PHYSICIAN:  Fleet Contras, M.D.                 DATE OF BIRTH:  [birth year]   DATE OF ADMISSION:  07/20/2002  DATE OF DISCHARGE:  07/23/2002                                 DISCHARGE SUMMARY   ADMISSION DIAGNOSES:  1. Acute on chronic pancreatitis.  2. Uncontrolled type 2 diabetes mellitus.   HISTORY OF PRESENT ILLNESS:  The patient is a 60 year old African-American  male with a history of recurrent acute pancreatitis who was admitted via the  emergency room with symptoms of abdominal pain, nausea and vomiting of a few  days duration.  He denies any use of alcohol.  Admits using marijuana, and  had been out of his medications for a few days.   LABORATORY DATA:  On admission, his amylase was 244, lipase was 40, alkaline  phosphatase was 16, AST was 129, ALT was 178.  Albumin was 4.2.   HOSPITAL COURSE:  On admission, the patient was started on intravenous  fluids with normal saline at 150 cc an hour.  He received Demerol 50 mg with  Phenergan 12.5 mg q.4-6h. p.r.n.  CBG's were monitored q.a.c. and h.s.  An  ultrasound scan was performed to rule out gallstones, and this was negative.  His symptoms improved during the course of admission.  He also had mild  hypokalemia which was repleted by mouth.  His AST dropped to 91, and ALT  dropped to 143.  Amylase dropped to 191 by the second day of admission.  On  07/23/02, the patient was ready for discharge home with resolution of his  symptoms.  He was afebrile, abdomen was benign.  His potassium was 4.1 on  the day of discharge.   DISCHARGE DIAGNOSIS:  Acute on chronic pancreatitis.   DISCHARGE MEDICATIONS:  1. Mepergan 50/25 one q.6h. p.r.n.  2. Protonix 40 mg on q.d.  3. Pancrease 3 capsules t.i.d. a.c.  4. Reglan 10 mg  one p.o. t.i.d.   FOLLOWUP:  At [facility], [phone], in two weeks.                                                Fleet Contras, M.D.    EA/MEDQ  D:  08/27/2002  T:  08/27/2002  Job:  [id number]

## 2011-01-29 NOTE — Discharge Summary (Signed)
Colton Garcia, Colton Garcia              ACCOUNT NO.:  [id number]   MEDICAL RECORD NO.:  [id number]          PATIENT TYPE:  INP   LOCATION:  4728                         FACILITY:  [facility]   PHYSICIAN:  Jackie Plum, M.D.DATE OF BIRTH:  04/01/1951   DATE OF ADMISSION:  05/13/2006  DATE OF DISCHARGE:                                 DISCHARGE SUMMARY   DISCHARGE DIAGNOSES:  1. 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 Carola Frost on May 17, 2006.  2  History of diabetes mellitus.  1. Chronic pancreatitis.   LABS ON DISCHARGE:  WBC count 7.1, hemoglobin 12.6, hematocrit 36.9, MCV  89.0, platelet count 242 on May 18, 2006.  Sodium 142, potassium 3.9,  chloride 106, CO2 30, glucose 103, BUN 10, creatinine 1.0, calcium 9.6 and  Vancomycin level 18.4 on May 23, 2006.   CURRENT MEDICATIONS:  (This may be adjusted prior to final discharge and if  that is done, an addendum will be made in this regard).  1. Lovenox 40 mg subcutaneous injection daily for DVT prophylaxis.  2. Protonix 40 mg daily p.o.  3. Vancomycin 1250 mg IV q.12.h.  4. Tylenol 350 mg p.o. q.4.h. p.r.n.  5. Lortab one or two tablets q.4.h. p.r.n. p.o.  6. Ativan 0.5 mg p.o. t.i.d. p.r.n.  7. Morphine 1 or 2 mg IV q.4.h. p.r.n.  8. Phenergan 12.5 mg p.o. q.4. p.r.n.  9. Ambien 5 mg p.o. q.h.s. p.r.n.   REASON FOR ADMISSION:  Infected diabetic foot.  Patient is a 60 year old  gentleman who was admitted to the hospital on May 13, 2006, after  presenting with left foot pain.  Patient had foul-smelling serosanguineous  draining fluid from his fifth toe area.  He was seen and admitted to the  hospital by Dr. Crista Curb.  On admission, patient patient's vital  signs were stable and his cardiopulmonary exam was unremarkable, he had a  swollen left foot with ulceration about the medial aspect of  his fifth toe.  His pulses were intact.  CBC was negative for any leukocytosis.  __________  evaluation and management.  On admission, patient was started on IV  vancomycin and Zosyn.  Dr. Carola Frost, with orthopedics was consulted,  __________  evaluation felt the patient has osteomyelitis and went ahead and  did an orthopedic procedure.  Post procedure, patient had received  antibiotics, does not have any insurance, __________ difficult to place him  on a __________ .  He requests IV antibiotics for about four weeks.  Social  worker has been helping Colton Garcia with this placement issue.  This dictation is  being done in anticipation of discharge pretty soon after resolution of  placement issues versus antibiotic administration as an outpatient.  On  rounds today, patient did not have any fever or chills, denies any nausea or  vomiting.  He would like to speak to Dr. Carola Frost further.   PHYSICAL EXAMINATION:  His vital signs:  BP of 152/58, pulse 88,  respirations 16, temp 97.8 degrees Fahrenheit.  O2 sat 96% on room air.  CBG  was 94 mg/dL.  Cardiopulmonary exam was unremarkable.  Abdomen:  Soft,  nontender.  Extremities:  No cyanosis.  The infected foot is dressed and was  not viewed.  Patient is going to be  evaluated by __________  service and he will be discharged for outpatient  antibiotics once his __________  issues are resolved.  Will need continued  wound care on discharge and may be discharged home with home health  __________  if that can be arranged.      Jackie Plum, M.D.  Electronically Signed     GO/MEDQ  D:  05/24/2006  T:  05/24/2006  Job:  [id number]

## 2011-01-29 NOTE — Op Note (Signed)
NAMEKYNG, MATLOCK              ACCOUNT NO.:  [id number]   MEDICAL RECORD NO.:  [id number]          PATIENT TYPE:  INP   LOCATION:  4728                         FACILITY:  [facility]   PHYSICIAN:  Doralee Albino. Carola Frost, M.D. DATE OF BIRTH:  1951/05/26   DATE OF PROCEDURE:  05/17/2006  DATE OF DISCHARGE:                                 OPERATIVE REPORT   PREOPERATIVE DIAGNOSIS:  Left fifth toe osteomyelitis and soft tissue  abscess.   POSTOPERATIVE DIAGNOSIS:  Left fifth toe osteomyelitis and soft tissue  abscess.   PROCEDURE:  1. Left fifth toe amputation.  2. Excision of fifth metatarsal head.  3. Debridement.   SURGEON:  Myrene Galas, MD   ASSISTANT:  None.   ANESTHESIA:  General.   SPECIMENS:  Two cultures sent to micro.   ESTIMATED BLOOD LOSS:  50 mL.   COMPLICATIONS:  None.   TOURNIQUET:  None.   DISPOSITION:  To PACU.   CONDITION:  Stable.   BRIEF SUMMARY AND INDICATION FOR PROCEDURE:  Colton Garcia is a 60 year old  male with a past medical history notable for diabetes and an 8-day history  of abscess and soft tissue infection around his fifth toe.  This was  positive for MRSA, and the patient had failed fluoroquinolone for treatment.  He was evaluated preoperatively by Dr. Lajoyce Corners, who felt that his MRI was  indeed consistent with osteomyelitis in variance with the radiologist  reading.  A recommended fifth toe amputation with excision of the metatarsal  head as well.  We did discuss this with Mr. Watkinson, and he understood that  we would perform a debridement if infection did appear such that he would  have a difficult time healing it, then we should go ahead and continue with  the amputation.  He understood the risk in either case to include persistent  infection, failure to heal, and need for further surgery including revision  amputation.   DESCRIPTION OF PROCEDURE:  Mr. Cordoba remained on Zosyn and vancomycin and  was taken to the operating room where  general anesthesia was induced.  His  left lower extremity was prepped and draped in the usual sterile fashion.  The wound of the fifth toe was examined after the patient was fully asleep  and necrotic debris removed.  This left less two-thirds of the skin around  the toe.  Dissection proximally did indicate tracking infection.  Consequently we felt that toe was nonviable, and a standard fifth toe  amputation was then made through a paddle type incision.  We also excised  the fifth metatarsal head with a bone cutter.  This bone specimen was sent  to pathology.  Copious irrigation was performed including removal of the  flexor tendon because of its large avascular nature and thorough  debridement.  The wound was then closed over fourth-inch Penrose drain to  allow enough egress.  A sterile gently compressive dressing was then  applied, patient awakened from anesthesia, and transported to the PACU in  stable condition.   PROGNOSIS:  Mr. Charrette will continue to be watched carefully for resolution  of his  infection.  We hopefully removed all the infected material, and then  he can go on to both heal his wound as well as resolve his infection  entirely.  We will restrict his weightbearing initially and allow graduated  weightbearing sometime after 4 weeks.  He will continued be followed by the  medical service.      Doralee Albino. Carola Frost, M.D.  Electronically Signed     MHH/MEDQ  D:  05/17/2006  T:  05/17/2006  Job:  [id number]
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
## 2011-01-29 NOTE — Discharge Summary (Signed)
NAMENICKEY, KLOEPFER                        ACCOUNT NO.:  [id number]   MEDICAL RECORD NO.:  [id number]                  PATIENT TYPE:  INP   LOCATION:                                       FACILITY:  [facility]   PHYSICIAN:  Edwin A. Concepcion Elk, M.D.              DATE OF BIRTH:   DATE OF ADMISSION:  03/06/2002  DATE OF DISCHARGE:  03/10/2002                                 DISCHARGE SUMMARY   PRESENTING COMPLAINT:  1. Severe abdominal pain.  2. Nausea and vomiting.   ADMITTING DIAGNOSES:  1. Acute pancreatitis.  2. Diabetes mellitus, uncontrolled.  3. Chronic pancreatitis.   DISCHARGE DIAGNOSES:  1. Acute pancreatitis.  2. Diabetes mellitus, uncontrolled.   PRESENTATION:  The patient is a 60 year old African American gentleman with  a history of chronic pancreatitis secondary to remote alcohol use.  He  denies any current use of alcohol.  He had run out of his chronic  pancreatitis medications towards the end of the weekend and presented to the  emergency room with severe abdominal pain, nausea, vomiting and unable to  keep his food and drink down.  He had to be admitted for intravenous fluid  hydration, analgesic medications as well as diet evaluations as necessary.   HOSPITAL COURSE:  On admission, the patient was started on intravenous  fluids as well as analgesics and antiemetic agents.  He also received  intravenous Protonix 40 mg q.d.  CBGs q.a.c. and h.s. were monitored.  He  received morphine 2 to 4 mg IV q.4h. as well as Phenergan 25 mg IV q.6h.  His symptoms seemed to improve initially with conservative management but 48  hours after admission, the patient's symptoms worsened with persistent  vomiting, inability to keep any food down and he was therefore kept n.p.o.  A CT scan of the abdomen was performed and this showed normal pancreas with  circumferential soft tissue mass of the cecum with a normal appendix and  gastroenterologic consult was therefore requested  further evaluation with a  colonoscopy.  The patient was seen by Dr. Fayrene Fearing L. Edwards, who performed  colonoscopy on this patient and this was reportedly normal.  Now, patient's  symptoms improved within 48 hours after colonoscopy.  He was able to  tolerate full diet, he was well-hydrated and afebrile.  On March 10, 2002,  the patient was considered stable for discharge home.   DISCHARGE MEDICATIONS:  1. Protonix 40 mg every day.  2. Pancrease three capsules p.o. t.i.d. a.c.  3. Mepergan 50/25 mg one p.o. q.6h. p.r.n.    DIET:  He was to continue on a 2000-calorie low-fat diet until followup with  Dr. Runell Gess on March 15, 2002 was arranged.  Edwin A. Concepcion Elk, M.D.    EAA/MEDQ  D:  05/30/2002  T:  06/02/2002  Job:  [id number]

## 2011-01-29 NOTE — Consult Note (Signed)
NAMEWYLIE, Colton Garcia              ACCOUNT NO.:  [id number]   MEDICAL RECORD NO.:  [id number]          PATIENT TYPE:  INP   LOCATION:  4728                         FACILITY:  [facility]   PHYSICIAN:  Doralee Albino. Carola Frost, M.D. DATE OF BIRTH:  [birth year]   DATE OF CONSULTATION:  05/16/2006  DATE OF DISCHARGE:                                   CONSULTATION   REASON FOR CONSULTATION:  Left foot and small toe diabetic ulcer and  abscess.   BRIEF HISTORY OF PRESENTATION:  Colton Garcia is a 60 year old black male with  a history of diabetes who has had a progressive foot infection over the past  8 days.  He initially presented to the emergency room at [facility] and was  per provided with antibiotics and a follow-up appointment with Dr. Concepcion Elk.  However, the patient had been dismissed from the practice and never followed  up.  It is unclear whether he actually got the levofloxacin or whether he  began taking another antibiotic that he found in his home which may have  been several years old.  He subsequently presented on May 13, 2006 to the  ER at [facility] and was admitted by the medical service and undergone  workup for possible osteomyelitis.  Plain film and MRI were negative for  bone infection.  MRI did demonstrate a soft tissue abscess involving both  the dorsal and medial as well as some infection along the plantar surface.  He has been on vancomycin empirically and is awaiting formal culture  results. Blood cultures have been negative to date.   PAST MEDICAL HISTORY:  1. Chronic pancreatitis.  2. Diabetes.   MEDICATIONS:  1. Vancomycin.  2. Pain medications.   SOCIAL HISTORY:  The patient is a previous drinker.  He smokes 2-3  cigarettes a day.  He smokes marijuana on the weekends.   FAMILY HISTORY:  Notable for mother with diabetes.   REVIEW OF SYSTEMS:  He denies fever, chills, or other illness, and he also  denies previous history of foot infection.   PHYSICAL  EXAMINATION:  GENERAL:  He does not appear to be in any distress.  He has remained afebrile throughout his hospitalization, with stable vital  signs.  He does appear well nourished, well developed.  EXTREMITIES:  Examination of the left lower extremity is notable for  erythema and draining hypertrophic abscess at the base of the small toe. The  tip of the small toe does not appear to be significantly involved. There is  no clear passageway for deep drainage.  Dorsalis pedis pulse is 1+,  posterior tibialis pulse is 2+. There is no erythema above the ankle.  No  significant tenderness with palpation of the flexor or extensor tendons  proximal to the infection.  The patient does have quite a bit of pain with  attempted probing of the wound, and this is experienced on a slightly  delayed basis in keeping with a neuropathy.  He does not have any  significant clubbing, and temperature is equal, with the contralateral side  being more.   LABORATORY STUDIES:  Multiple  labs were obtain, demonstrating renal function  within normal limits.  No significant elevation of the white count.  Sedimentation rate and CRP have not been drawn. Hemoglobin A1c is elevated  at 7.7.   Plain film and MRI again demonstrate no evidence of osteomyelitis, with soft  tissue abscess primarily dorsally along the small left toe, but with some  fusiform type swelling extending medially and onto the plantar surface.   ASSESSMENT:  Left foot abscess with jeopardized small toe.   PLAN:  I have asked Dr. Aldean Baker to consult on this patient as well for  assistance with regard to operative plan as well as postoperative assistance  with management, given his expertise in this area. I have discussed quite  frankly with Mr. Weedon the possibility of small toe amputation if this  would facilitate resolution of his infection and prevent him from having  serial procedures which ultimately may have the same result.  At this  time,  however, I am hopeful that his toe is viable and that a limited aggressive  debridement will be successful in resolution of this infection.  I have  counseled him regarding as smoking cessation of all substances, as well as  importance of tight sugar control.  I have discussed the risks and benefits  of surgery, including the possibility of amputation of the small toe during  the procedure, failure to resolve his infection, need for further surgery,  phantom pain, and other possible complications of.  The patient wishes to  proceed.      Doralee Albino. Carola Frost, M.D.  Electronically Signed     MHH/MEDQ  D:  05/16/2006  T:  05/16/2006  Job:  [id number]

## 2011-01-29 NOTE — Discharge Summary (Signed)
[facility]  Patient:    Colton Garcia, Colton Garcia Visit Number: [id number] MRN: [id number]          Service Type: MED Location: 3W [phone] Attending Physician:  Gaspar Garbe Dictated by:   Gaspar Garbe, M.D. Admit Date:  12/18/2001 Discharge Date: 12/20/2001                             Discharge Summary  DIAGNOSES: 1. Acute gastritis. 2. Hepatitis B. 3. Hepatitis C. 4. Hypertension.  DISCHARGE LABORATORY DATA:  White count 5.2, hemoglobin 13.7, hematocrit 40.5, platelets 157.  BUN and creatinine are 5 and 1.0, respectively.  Bilirubin 0.8, alkaline phosphatase 60, AST 76, ALT 90, albumin 3.3, amylase 187, lipase 30.  DISCHARGE MEDICATIONS: 1. Protonix 40 mg p.o. q.d. x30 days. 2. Phenergan 25 mg p.o. q.4h. p.r.n. nausea #30 with no refills given.  HOSPITAL COURSE:  The patient was admitted on 12/18/01.  He indicated to the emergency room physicians that he has had episodes of pancreatitis before, although he indicates that he has never been hospitalized from them.  He also indicated that his last visit to the hospital was two months prior, but that he did not stay but 10 hours in the emergency room.  The patient was seen and assessed by me, noted to have an elevated amylase, but with normal lipase, this being very nonspecific indication of pancreatic inflammation, as there are other reasons that amylase can be elevated, and even to this low extent would not be considered to be grossly abnormal.  Suspicious as to the patients pre-given diagnosis of pancreatitis per the emergency room physicians.  The patient rested on 12/18/01, and was made n.p.o., given Protonix after having a mostly benign laboratory results with elevated transaminases, but no biliary or other pancreatic enzymes elevated.  On 12/19/01, the patient was noting that his pain was considerably better.  He was given Percocet p.r.n., but rarely used these.  He started with a clear  liquid diet for breakfast, a thicker lunch, and tolerated these reasonably well.  He had some nausea and one episode of vomiting with a full regular meal.  Indicated that on the day of discharge, 12/20/01, that he was pain-free and wanting to go home. In addition, the patient noted that he felt considerably better, and other then this one episode of vomiting, did not have any other complaints.  He did not have any pain with the prior days vomiting.  The patient was fed a regular breakfast which he tolerated well, and was discharged before noontime on 12/20/01 in good condition.  Although the hepatitis B and C serologies were not verified at this visit, the patient was adamant that he had both of these diseases from a history of drug use, which he indicated that he stopped in 1981.  However, his toxicology screen came back positive for THC.  When asked again, the patient indicated that he was occasionally using marijuana, but as he claimed, in a medicinal way for his pain.  I indicated to him that this was an illegal drug, and that he would be best served by stopping his marijuana use altogether.  DISCHARGE INSTRUCTIONS: 1. It was indicated to the patient that he needed to eat a reasonable diet    which was low in fat and with low acidity for the next couple of days to    allow his stomach to heal. 2. Take his Protonix everyday.  3. Take his Phenergan just as he needed.  If he felt somewhat nauseated or    queasy, to use his Phenergan, and to scale back his eating if he developed    any worse pain or other symptoms that were any different than those at the    time of his hospitalization, he is to return to the emergency room for    further treatment. Dictated by:   Gaspar Garbe, M.D. Attending Physician:  Gaspar Garbe DD:  12/20/01 TD:  12/21/01 Job: [id number]

## 2011-04-10 ENCOUNTER — Emergency Department (HOSPITAL_COMMUNITY): Payer: Self-pay

## 2011-04-10 ENCOUNTER — Encounter (HOSPITAL_COMMUNITY): Payer: Self-pay | Admitting: Radiology

## 2011-04-10 ENCOUNTER — Emergency Department (HOSPITAL_COMMUNITY)
Admission: EM | Admit: 2011-04-10 | Discharge: 2011-04-10 | Disposition: A | Discharge status: Home / Self Care | Payer: Self-pay | Attending: Emergency Medicine | Admitting: Emergency Medicine

## 2011-04-10 DIAGNOSIS — R112 Nausea with vomiting, unspecified: Secondary | ICD-10-CM | POA: Insufficient documentation

## 2011-04-10 DIAGNOSIS — R066 Hiccough: Secondary | ICD-10-CM | POA: Insufficient documentation

## 2011-04-10 DIAGNOSIS — Z8619 Personal history of other infectious and parasitic diseases: Secondary | ICD-10-CM | POA: Insufficient documentation

## 2011-04-10 DIAGNOSIS — E119 Type 2 diabetes mellitus without complications: Secondary | ICD-10-CM | POA: Insufficient documentation

## 2011-04-10 DIAGNOSIS — K219 Gastro-esophageal reflux disease without esophagitis: Secondary | ICD-10-CM | POA: Insufficient documentation

## 2011-04-10 DIAGNOSIS — I10 Essential (primary) hypertension: Secondary | ICD-10-CM | POA: Insufficient documentation

## 2011-04-10 DIAGNOSIS — R51 Headache: Secondary | ICD-10-CM | POA: Insufficient documentation

## 2011-04-10 DIAGNOSIS — R109 Unspecified abdominal pain: Secondary | ICD-10-CM | POA: Insufficient documentation

## 2011-04-10 DIAGNOSIS — K861 Other chronic pancreatitis: Secondary | ICD-10-CM | POA: Insufficient documentation

## 2011-04-10 DIAGNOSIS — Z79899 Other long term (current) drug therapy: Secondary | ICD-10-CM | POA: Insufficient documentation

## 2011-04-10 HISTORY — DX: Essential (primary) hypertension: I10

## 2011-04-10 LAB — POCT I-STAT, CHEM 8
BUN: 17 mg/dL (ref 6–23)
Chloride: 99 mEq/L (ref 96–112)
Creatinine, Ser: 1.3 mg/dL (ref 0.50–1.35)
Sodium: 139 mEq/L (ref 135–145)
TCO2: 31 mmol/L (ref 0–100)

## 2011-04-10 LAB — CBC
Hemoglobin: 13.5 g/dL (ref 13.0–17.0)
MCH: 28 pg (ref 26.0–34.0)
RBC: 4.82 MIL/uL (ref 4.22–5.81)
WBC: 6 10*3/uL (ref 4.0–10.5)

## 2011-04-10 LAB — COMPREHENSIVE METABOLIC PANEL
ALT: 129 U/L — ABNORMAL HIGH (ref 0–53)
AST: 75 U/L — ABNORMAL HIGH (ref 0–37)
Albumin: 4.1 g/dL (ref 3.5–5.2)
Alkaline Phosphatase: 108 U/L (ref 39–117)
Calcium: 10 mg/dL (ref 8.4–10.5)
Glucose, Bld: 312 mg/dL — ABNORMAL HIGH (ref 70–99)
Potassium: 5 mEq/L (ref 3.5–5.1)
Sodium: 136 mEq/L (ref 135–145)
Total Protein: 7.8 g/dL (ref 6.0–8.3)

## 2011-04-10 LAB — DIFFERENTIAL
Basophils Relative: 0 % (ref 0–1)
Lymphs Abs: 3.2 10*3/uL (ref 0.7–4.0)
Monocytes Relative: 7 % (ref 3–12)
Neutro Abs: 2.2 10*3/uL (ref 1.7–7.7)
Neutrophils Relative %: 37 % — ABNORMAL LOW (ref 43–77)

## 2011-04-10 LAB — TROPONIN I: Troponin I: <0.3 ng/mL (ref <0.30)

## 2011-04-10 LAB — CK TOTAL AND CKMB (NOT AT ARMC)
CK, MB: 2.8 ng/mL (ref 0.3–4.0)
Relative Index: 1.7 (ref 0.0–2.5)

## 2011-04-10 MED ORDER — IOHEXOL 300 MG/ML  SOLN
100.0000 mL | Freq: Once | INTRAMUSCULAR | Status: AC | PRN
  Administered 2011-04-10: 100 mL via INTRAVENOUS

## 2012-11-18 ENCOUNTER — Emergency Department (HOSPITAL_COMMUNITY)
Admission: EM | Admit: 2012-11-18 | Discharge: 2012-11-18 | Disposition: A | Discharge status: Home / Self Care | Payer: Self-pay | Attending: Emergency Medicine | Admitting: Emergency Medicine

## 2012-11-18 ENCOUNTER — Emergency Department (HOSPITAL_COMMUNITY): Payer: Self-pay

## 2012-11-18 ENCOUNTER — Encounter (HOSPITAL_COMMUNITY): Payer: Self-pay

## 2012-11-18 DIAGNOSIS — S98919A Complete traumatic amputation of unspecified foot, level unspecified, initial encounter: Secondary | ICD-10-CM | POA: Insufficient documentation

## 2012-11-18 DIAGNOSIS — Z91199 Patient's noncompliance with other medical treatment and regimen due to unspecified reason: Secondary | ICD-10-CM | POA: Insufficient documentation

## 2012-11-18 DIAGNOSIS — I10 Essential (primary) hypertension: Secondary | ICD-10-CM | POA: Insufficient documentation

## 2012-11-18 DIAGNOSIS — Z79899 Other long term (current) drug therapy: Secondary | ICD-10-CM | POA: Insufficient documentation

## 2012-11-18 DIAGNOSIS — R739 Hyperglycemia, unspecified: Secondary | ICD-10-CM

## 2012-11-18 DIAGNOSIS — Z9119 Patient's noncompliance with other medical treatment and regimen: Secondary | ICD-10-CM | POA: Insufficient documentation

## 2012-11-18 DIAGNOSIS — M25579 Pain in unspecified ankle and joints of unspecified foot: Secondary | ICD-10-CM | POA: Insufficient documentation

## 2012-11-18 DIAGNOSIS — E1169 Type 2 diabetes mellitus with other specified complication: Secondary | ICD-10-CM | POA: Insufficient documentation

## 2012-11-18 DIAGNOSIS — F17200 Nicotine dependence, unspecified, uncomplicated: Secondary | ICD-10-CM | POA: Insufficient documentation

## 2012-11-18 DIAGNOSIS — Z8719 Personal history of other diseases of the digestive system: Secondary | ICD-10-CM | POA: Insufficient documentation

## 2012-11-18 HISTORY — DX: Other chronic pancreatitis: K86.1

## 2012-11-18 LAB — GLUCOSE, CAPILLARY: Glucose-Capillary: 182 mg/dL — ABNORMAL HIGH (ref 70–99)

## 2012-11-18 MED ORDER — METFORMIN HCL 500 MG PO TABS: 500.0000 mg | ORAL_TABLET | Freq: Two times a day (BID) | ORAL | Status: DC

## 2012-11-18 NOTE — ED Notes (Signed)
Pt states foot pain started about 4 days ago; pt states that he is diabetic but has no been taking medication for diabetes since November, but has been watching what he eats; pt alert and mentating appropriately; Pt denies fever/chills; pt denies dizziness and lightheadedness; pt c/o headaches; pt denies blurred vision; pt denies n/v/d.

## 2012-11-18 NOTE — ED Notes (Signed)
Pt given d/c teaching and prescriptions; pt alert and mentating appropriately upon d/c; pt given resource guide and explained use; pt verbalizes understanding and has no further questions upon d/c; pt NAD noted upon d/c.
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
## 2012-11-18 NOTE — ED Notes (Signed)
Pt states that about 4 days ago he began having Left foot pain.  Pt is diabetic.  Pt cannot see the bottom of his foot, but states there is pain at the toes.  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

## 2013-09-07 ENCOUNTER — Emergency Department (HOSPITAL_COMMUNITY)
Admission: EM | Admit: 2013-09-07 | Discharge: 2013-09-07 | Disposition: A | Discharge status: Home / Self Care | Payer: Self-pay | Attending: Emergency Medicine | Admitting: Emergency Medicine

## 2013-09-07 ENCOUNTER — Encounter (HOSPITAL_COMMUNITY): Payer: Self-pay | Admitting: Emergency Medicine

## 2013-09-07 DIAGNOSIS — Z8719 Personal history of other diseases of the digestive system: Secondary | ICD-10-CM | POA: Insufficient documentation

## 2013-09-07 DIAGNOSIS — L02818 Cutaneous abscess of other sites: Secondary | ICD-10-CM | POA: Insufficient documentation

## 2013-09-07 DIAGNOSIS — F17200 Nicotine dependence, unspecified, uncomplicated: Secondary | ICD-10-CM | POA: Insufficient documentation

## 2013-09-07 DIAGNOSIS — L0291 Cutaneous abscess, unspecified: Secondary | ICD-10-CM

## 2013-09-07 DIAGNOSIS — Z791 Long term (current) use of non-steroidal anti-inflammatories (NSAID): Secondary | ICD-10-CM | POA: Insufficient documentation

## 2013-09-07 DIAGNOSIS — Z792 Long term (current) use of antibiotics: Secondary | ICD-10-CM | POA: Insufficient documentation

## 2013-09-07 DIAGNOSIS — Z9089 Acquired absence of other organs: Secondary | ICD-10-CM | POA: Insufficient documentation

## 2013-09-07 DIAGNOSIS — I10 Essential (primary) hypertension: Secondary | ICD-10-CM | POA: Insufficient documentation

## 2013-09-07 DIAGNOSIS — E119 Type 2 diabetes mellitus without complications: Secondary | ICD-10-CM | POA: Insufficient documentation

## 2013-09-07 NOTE — ED Notes (Signed)
Per pt sts 10 days of abscess to back of head. Pt has another spot directly next to abscess that has already drained.
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
## 2013-09-07 NOTE — ED Notes (Signed)
Abscess to back of head x 2 weeks.

## 2013-09-09 ENCOUNTER — Encounter (HOSPITAL_COMMUNITY): Payer: Self-pay | Admitting: Emergency Medicine

## 2013-09-09 ENCOUNTER — Emergency Department (HOSPITAL_COMMUNITY)
Admission: EM | Admit: 2013-09-09 | Discharge: 2013-09-09 | Disposition: A | Discharge status: Home / Self Care | Payer: Self-pay | Attending: Emergency Medicine | Admitting: Emergency Medicine

## 2013-09-09 DIAGNOSIS — Z4801 Encounter for change or removal of surgical wound dressing: Secondary | ICD-10-CM | POA: Insufficient documentation

## 2013-09-09 DIAGNOSIS — Z5189 Encounter for other specified aftercare: Secondary | ICD-10-CM

## 2013-09-09 DIAGNOSIS — F17200 Nicotine dependence, unspecified, uncomplicated: Secondary | ICD-10-CM | POA: Insufficient documentation

## 2013-09-09 DIAGNOSIS — I10 Essential (primary) hypertension: Secondary | ICD-10-CM | POA: Insufficient documentation

## 2013-09-09 DIAGNOSIS — E119 Type 2 diabetes mellitus without complications: Secondary | ICD-10-CM | POA: Insufficient documentation

## 2013-09-09 DIAGNOSIS — Z8719 Personal history of other diseases of the digestive system: Secondary | ICD-10-CM | POA: Insufficient documentation

## 2013-09-09 MED ORDER — SULFAMETHOXAZOLE-TRIMETHOPRIM 800-160 MG PO TABS: 1.0000 | ORAL_TABLET | Freq: Two times a day (BID) | ORAL | Status: DC

## 2013-09-09 NOTE — ED Notes (Signed)
Reports having abscess on back of head, was seen on 12/26 for it and told to come back today for wound check and packing removal. No other complaints.

## 2013-09-09 NOTE — ED Provider Notes (Signed)
Medical screening examination/treatment/procedure(s) were performed by non-physician practitioner and as supervising physician I was immediately available for consultation/collaboration.     Jadalynn Burr, MD 09/09/13 1450 
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
## 2013-09-24 NOTE — ED Provider Notes (Signed)
Medical screening examination/treatment/procedure(s) were performed by non-physician practitioner, Harvie Heck, on 09/07/13 and as supervising physician I was immediately available for consultation/collaboration.  EKG Interpretation   None         Delice Bison Ward, DO 09/24/13 7681

## 2013-10-04 ENCOUNTER — Encounter (HOSPITAL_COMMUNITY): Payer: Self-pay | Admitting: Emergency Medicine

## 2013-10-04 ENCOUNTER — Emergency Department (HOSPITAL_COMMUNITY)
Admission: EM | Admit: 2013-10-04 | Discharge: 2013-10-04 | Disposition: A | Discharge status: Home / Self Care | Payer: Self-pay | Attending: Emergency Medicine | Admitting: Emergency Medicine

## 2013-10-04 DIAGNOSIS — M542 Cervicalgia: Secondary | ICD-10-CM | POA: Insufficient documentation

## 2013-10-04 DIAGNOSIS — Z791 Long term (current) use of non-steroidal anti-inflammatories (NSAID): Secondary | ICD-10-CM | POA: Insufficient documentation

## 2013-10-04 DIAGNOSIS — I10 Essential (primary) hypertension: Secondary | ICD-10-CM | POA: Insufficient documentation

## 2013-10-04 DIAGNOSIS — E119 Type 2 diabetes mellitus without complications: Secondary | ICD-10-CM | POA: Insufficient documentation

## 2013-10-04 DIAGNOSIS — M5380 Other specified dorsopathies, site unspecified: Secondary | ICD-10-CM | POA: Insufficient documentation

## 2013-10-04 DIAGNOSIS — R51 Headache: Secondary | ICD-10-CM | POA: Insufficient documentation

## 2013-10-04 DIAGNOSIS — F17200 Nicotine dependence, unspecified, uncomplicated: Secondary | ICD-10-CM | POA: Insufficient documentation

## 2013-10-04 DIAGNOSIS — Z8719 Personal history of other diseases of the digestive system: Secondary | ICD-10-CM | POA: Insufficient documentation

## 2013-10-04 MED ORDER — SODIUM CHLORIDE 0.9 % IV BOLUS (SEPSIS): 1000.0000 mL | Freq: Once | INTRAVENOUS | Status: DC

## 2013-10-04 MED ORDER — HYDROCODONE-ACETAMINOPHEN 5-325 MG PO TABS: 1.0000 | ORAL_TABLET | Freq: Four times a day (QID) | ORAL | Status: DC | PRN

## 2013-10-04 MED ORDER — DIAZEPAM 5 MG PO TABS
5.0000 mg | ORAL_TABLET | Freq: Once | ORAL | Status: AC
  Administered 2013-10-04: 5 mg via ORAL
  Filled 2013-10-04: qty 1

## 2013-10-04 MED ORDER — IBUPROFEN 600 MG PO TABS: 600.0000 mg | ORAL_TABLET | Freq: Three times a day (TID) | ORAL | Status: AC

## 2013-10-04 MED ORDER — DIAZEPAM 5 MG PO TABS: 5.0000 mg | ORAL_TABLET | Freq: Two times a day (BID) | ORAL | Status: AC

## 2013-10-04 MED ORDER — KETOROLAC TROMETHAMINE 30 MG/ML IJ SOLN
30.0000 mg | Freq: Once | INTRAMUSCULAR | Status: AC
  Administered 2013-10-04: 30 mg via INTRAMUSCULAR
  Filled 2013-10-04: qty 1

## 2013-10-04 MED ORDER — HYDROCODONE-ACETAMINOPHEN 5-325 MG PO TABS
1.0000 | ORAL_TABLET | Freq: Once | ORAL | Status: AC
  Administered 2013-10-04: 1 via ORAL
  Filled 2013-10-04: qty 1

## 2013-10-04 NOTE — ED Notes (Signed)
Pt from home with c/o neck pain and headache starting this past Monday afternoon.  Pt denies injury to neck but pain started on right side of neck initially.  Pt in NAD, A&O.
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
## 2013-10-04 NOTE — Discharge Instructions (Signed)
As discussed, your evaluation is largely reassuring.  It is important to take all medication as directed, and follow up with your primary care physician.  Return here for any concerning changes in your condition.

## 2016-04-04 ENCOUNTER — Encounter (HOSPITAL_COMMUNITY): Payer: Self-pay | Admitting: Emergency Medicine

## 2016-04-04 ENCOUNTER — Emergency Department (HOSPITAL_COMMUNITY)
Admission: EM | Admit: 2016-04-04 | Discharge: 2016-04-04 | Disposition: A | Discharge status: Home / Self Care | Payer: Medicare Other | Attending: Emergency Medicine | Admitting: Emergency Medicine

## 2016-04-04 DIAGNOSIS — T675XXA Heat exhaustion, unspecified, initial encounter: Secondary | ICD-10-CM | POA: Diagnosis not present

## 2016-04-04 DIAGNOSIS — E119 Type 2 diabetes mellitus without complications: Secondary | ICD-10-CM | POA: Diagnosis not present

## 2016-04-04 DIAGNOSIS — R42 Dizziness and giddiness: Secondary | ICD-10-CM | POA: Diagnosis present

## 2016-04-04 DIAGNOSIS — N179 Acute kidney failure, unspecified: Secondary | ICD-10-CM

## 2016-04-04 DIAGNOSIS — E860 Dehydration: Secondary | ICD-10-CM

## 2016-04-04 DIAGNOSIS — F17210 Nicotine dependence, cigarettes, uncomplicated: Secondary | ICD-10-CM | POA: Insufficient documentation

## 2016-04-04 DIAGNOSIS — I10 Essential (primary) hypertension: Secondary | ICD-10-CM | POA: Insufficient documentation

## 2016-04-04 LAB — CBC
HCT: 42.2 % (ref 39.0–52.0)
HEMOGLOBIN: 14.2 g/dL (ref 13.0–17.0)
MCH: 29.5 pg (ref 26.0–34.0)
MCHC: 33.6 g/dL (ref 30.0–36.0)
MCV: 87.6 fL (ref 78.0–100.0)
PLATELETS: 248 10*3/uL (ref 150–400)
RBC: 4.82 MIL/uL (ref 4.22–5.81)
RDW: 13.5 % (ref 11.5–15.5)
WBC: 9.1 10*3/uL (ref 4.0–10.5)

## 2016-04-04 LAB — COMPREHENSIVE METABOLIC PANEL
ALT: 86 U/L — ABNORMAL HIGH (ref 17–63)
ANION GAP: 13 (ref 5–15)
AST: 67 U/L — AB (ref 15–41)
Albumin: 4.8 g/dL (ref 3.5–5.0)
Alkaline Phosphatase: 91 U/L (ref 38–126)
BUN: 19 mg/dL (ref 6–20)
CHLORIDE: 98 mmol/L — AB (ref 101–111)
CO2: 24 mmol/L (ref 22–32)
Calcium: 10.1 mg/dL (ref 8.9–10.3)
Creatinine, Ser: 3.14 mg/dL — ABNORMAL HIGH (ref 0.61–1.24)
GFR calc Af Amer: 22 mL/min — ABNORMAL LOW (ref >60)
GFR calc non Af Amer: 19 mL/min — ABNORMAL LOW (ref >60)
GLUCOSE: 171 mg/dL — AB (ref 65–99)
POTASSIUM: 3.9 mmol/L (ref 3.5–5.1)
SODIUM: 135 mmol/L (ref 135–145)
Total Bilirubin: 0.7 mg/dL (ref 0.3–1.2)
Total Protein: 8.7 g/dL — ABNORMAL HIGH (ref 6.5–8.1)

## 2016-04-04 LAB — DIFFERENTIAL
BASOS ABS: 0 10*3/uL (ref 0.0–0.1)
Basophils Relative: 0 %
EOS ABS: 0 10*3/uL (ref 0.0–0.7)
EOS PCT: 0 %
LYMPHS ABS: 3.1 10*3/uL (ref 0.7–4.0)
Lymphocytes Relative: 34 %
Monocytes Absolute: 0.9 10*3/uL (ref 0.1–1.0)
Monocytes Relative: 9 %
NEUTROS PCT: 57 %
Neutro Abs: 5.1 10*3/uL (ref 1.7–7.7)

## 2016-04-04 LAB — TROPONIN I: Troponin I: <0.03 ng/mL (ref <0.03)

## 2016-04-04 MED ORDER — HEPARIN SODIUM (PORCINE) 5000 UNIT/ML IJ SOLN: 60.0000 [IU]/kg | INTRAMUSCULAR | Status: DC

## 2016-04-04 MED ORDER — SODIUM CHLORIDE 0.9 % IV BOLUS (SEPSIS)
1000.0000 mL | Freq: Once | INTRAVENOUS | Status: AC
  Administered 2016-04-04: 1000 mL via INTRAVENOUS

## 2016-04-04 MED ORDER — LACTATED RINGERS IV BOLUS (SEPSIS): 1000.0000 mL | Freq: Once | INTRAVENOUS | Status: DC

## 2016-04-04 MED ORDER — SODIUM CHLORIDE 0.9 % IV SOLN
10.0000 mL/h | INTRAVENOUS | Status: DC
Start: 2016-04-04 — End: 2016-04-04
  Administered 2016-04-04: 20 mL/h via INTRAVENOUS

## 2016-04-04 NOTE — Progress Notes (Signed)
   04/04/16 1724  Clinical Encounter Type  Visited With Patient  Visit Type ED  Referral From Nurse  Consult/Referral To Chaplain  Stress Factors  Patient Stress Factors Major life changes  Advance Directives (For Healthcare)  Does patient have an advance directive? No  Would patient like information on creating an advanced directive? No - patient declined information  [facility] responded to STEMI. Asked patient if could call a family member.  Patient said it wasn't necessary.   Roe Coombs 04/04/16

## 2016-04-04 NOTE — Discharge Instructions (Signed)
Follow-up with primary doctor this week for repeat kidney function. Stay well-hydrated.  If you were given medicines take as directed.  If you are on coumadin or contraceptives realize their levels and effectiveness is altered by many different medicines.  If you have any reaction (rash, tongues swelling, other) to the medicines stop taking and see a physician.    If your blood pressure was elevated in the ER make sure you follow up for management with a primary doctor or return for chest pain, shortness of breath or stroke symptoms.  Please follow up as directed and return to the ER or see a physician for new or worsening symptoms.  Thank you. Vitals:   04/04/16 1727 04/04/16 1730 04/04/16 1745 04/04/16 1800  BP:  123/72 126/73 122/71  Pulse:  78 63   Resp:  14 17 16   SpO2:  99% 100%   Weight: 172 lb (78 kg)     Height: 5\' 11"  (1.803 m)

## 2016-04-04 NOTE — ED Notes (Signed)
Patient denies pain and is resting comfortably.  

## 2016-04-04 NOTE — ED Notes (Signed)
Patient stood from bed and ambulated in room without difficulty. Denies symptoms with position change or ambulation.
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
## 2016-04-04 NOTE — ED Triage Notes (Signed)
Report from [facility]> pt Code Stemi- activated pta by EMS.  Pt works at [employer] and was outside at [employer].  Started c/o nausea, dizziness, vomited x1.  Pt denied chest pain.  Once pt moved inside to A/C symptoms resolved.  Dr. Reather Converse at bedside on arrival and Dr. Einar Gip at bedside within minutes of arrival.  Code Stemi cancelled per Dr. Einar Gip.

## 2016-06-24 ENCOUNTER — Emergency Department (HOSPITAL_COMMUNITY)
Admission: EM | Admit: 2016-06-24 | Discharge: 2016-06-24 | Disposition: A | Discharge status: Home / Self Care | Payer: Medicare Other | Attending: Emergency Medicine | Admitting: Emergency Medicine

## 2016-06-24 ENCOUNTER — Encounter (HOSPITAL_COMMUNITY): Payer: Self-pay | Admitting: *Deleted

## 2016-06-24 DIAGNOSIS — I10 Essential (primary) hypertension: Secondary | ICD-10-CM | POA: Diagnosis not present

## 2016-06-24 DIAGNOSIS — E119 Type 2 diabetes mellitus without complications: Secondary | ICD-10-CM | POA: Insufficient documentation

## 2016-06-24 DIAGNOSIS — F17210 Nicotine dependence, cigarettes, uncomplicated: Secondary | ICD-10-CM | POA: Insufficient documentation

## 2016-06-24 DIAGNOSIS — M79662 Pain in left lower leg: Secondary | ICD-10-CM | POA: Diagnosis present

## 2016-06-24 DIAGNOSIS — M79605 Pain in left leg: Secondary | ICD-10-CM

## 2016-06-24 MED ORDER — NAPROXEN 375 MG PO TABS: 375.0000 mg | ORAL_TABLET | Freq: Two times a day (BID) | ORAL | 0 refills | Status: DC
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
## 2016-06-24 NOTE — ED Triage Notes (Signed)
Pt c/o left lower leg pain. Pt believes he was bit by something. Pt states led feels like it is throbbing. No obvious bite marks noted.

## 2016-06-24 NOTE — ED Notes (Signed)
See np assessment

## 2016-06-24 NOTE — ED Provider Notes (Signed)
The patient is a 65 year old diabetic male with a prior history of amputation of the left small toe, he presents with swelling to the distal lower extremity below the knee, just above the ankle approximately 4-5 inches. There is a small area that is a nodule which is minimally tender, there is no redness, no induration,fluctuance, no other acute findings. He has full range of motion of the knee and the ankle without any pain. There is no obvious lump to site but there is a small area that feels like a mobile nodule which would be consistent with a lymph node. Anti-inflammatories, ice packs, follow-up with family doctor, he has a current follow-up on October 23 which should be okay and was the symptoms get worse. He was informed of the indications for return and was agreeable.  Medical screening examination/treatment/procedure(s) were conducted as a shared visit with non-physician practitioner(s) and myself.  I personally evaluated the patient during the encounter.  Clinical Impression:   Final diagnoses:  Left leg pain         Noemi Chapel, MD 06/26/16 [phone]

## 2016-10-20 ENCOUNTER — Ambulatory Visit (INDEPENDENT_AMBULATORY_CARE_PROVIDER_SITE_OTHER): Payer: Medicare Other | Admitting: Internal Medicine

## 2016-10-20 ENCOUNTER — Encounter: Payer: Self-pay | Admitting: Internal Medicine

## 2016-10-20 ENCOUNTER — Other Ambulatory Visit: Payer: Self-pay | Admitting: Internal Medicine

## 2016-10-20 VITALS — BP 136/65 | HR 73 | Temp 97.7°F | Wt 179.0 lb

## 2016-10-20 DIAGNOSIS — Z0000 Encounter for general adult medical examination without abnormal findings: Secondary | ICD-10-CM | POA: Diagnosis not present

## 2016-10-20 DIAGNOSIS — B182 Chronic viral hepatitis C: Secondary | ICD-10-CM | POA: Diagnosis present

## 2016-10-20 DIAGNOSIS — K7400 Hepatic fibrosis, unspecified: Secondary | ICD-10-CM

## 2016-10-20 LAB — CBC WITH DIFFERENTIAL/PLATELET
BASOS ABS: 51 {cells}/uL (ref 0–200)
Basophils Relative: 1 %
EOS ABS: 102 {cells}/uL (ref 15–500)
Eosinophils Relative: 2 %
HCT: 37.6 % — ABNORMAL LOW (ref 38.5–50.0)
Hemoglobin: 12.5 g/dL — ABNORMAL LOW (ref 13.2–17.1)
LYMPHS PCT: 48 %
Lymphs Abs: 2448 cells/uL (ref 850–3900)
MCH: 29.3 pg (ref 27.0–33.0)
MCHC: 33.2 g/dL (ref 32.0–36.0)
MCV: 88.1 fL (ref 80.0–100.0)
MPV: 9.6 fL (ref 7.5–12.5)
Monocytes Absolute: 510 cells/uL (ref 200–950)
Monocytes Relative: 10 %
NEUTROS PCT: 39 %
Neutro Abs: 1989 cells/uL (ref 1500–7800)
Platelets: 233 10*3/uL (ref 140–400)
RBC: 4.27 MIL/uL (ref 4.20–5.80)
RDW: 15.1 % — AB (ref 11.0–15.0)
WBC: 5.1 10*3/uL (ref 3.8–10.8)

## 2016-10-20 LAB — COMPLETE METABOLIC PANEL WITH GFR
ALT: 76 U/L — AB (ref 9–46)
AST: 58 U/L — ABNORMAL HIGH (ref 10–35)
Albumin: 4.2 g/dL (ref 3.6–5.1)
Alkaline Phosphatase: 77 U/L (ref 40–115)
BILIRUBIN TOTAL: 0.5 mg/dL (ref 0.2–1.2)
BUN: 11 mg/dL (ref 7–25)
CO2: 29 mmol/L (ref 20–31)
CREATININE: 1.22 mg/dL (ref 0.70–1.25)
Calcium: 10 mg/dL (ref 8.6–10.3)
Chloride: 108 mmol/L (ref 98–110)
GFR, EST AFRICAN AMERICAN: 71 mL/min (ref >=60)
GFR, Est Non African American: 62 mL/min (ref >=60)
GLUCOSE: 90 mg/dL (ref 65–99)
Potassium: 4.4 mmol/L (ref 3.5–5.3)
Sodium: 144 mmol/L (ref 135–146)
TOTAL PROTEIN: 7.2 g/dL (ref 6.1–8.1)

## 2016-10-20 NOTE — Progress Notes (Signed)
[facility] for Infectious Disease   CC: consideration for treatment for chronic hepatitis C  HPI:  +Colton Garcia is a 66 y.o. male who presents for initial evaluation and management of chronic hepatitis C.  Patient tested positive about 2 years ago. 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 Patient denies multiple sexual partners, sexual contact with person with liver disease, tattoos. Patient has had other studies performed. Results: hepatitis C RNA by PCR, result: positive. Patient has not had prior treatment for Hepatitis C. Patient does not have a past history of liver disease. Patient does not have a family history of liver disease. Patient does not  have associated signs or symptoms related to liver disease.  Labs reviewed and confirm chronic hepatitis C with a positive viral load.   Records reviewed from from PCP and tested his viral load and is positive at 63,934.  Previous CT scans of abdomen personally reviewed without any signs of cirrhosis.       Patient does not have documented immunity to Hepatitis A. Patient does not have documented immunity to Hepatitis B.    Review of Systems:  Constitutional: negative for fatigue and malaise Gastrointestinal: negative for diarrhea Integument/breast: negative for rash Musculoskeletal: negative for myalgias and arthralgias All other systems reviewed and are negative       Past Medical History:  Diagnosis Date  . Chronic pancreatitis ([place])   . Diabetes mellitus   . Hypertension     Prior to Admission medications   Medication Sig Start Date End Date Taking? Authorizing Provider  amLODipine (NORVASC) 10 MG tablet Take 10 mg by mouth daily.   Yes Historical Provider, MD  HYDROcodone-acetaminophen (NORCO/VICODIN) 5-325 MG per tablet Take 1 tablet by mouth every 6 (six) hours as needed for moderate pain. 10/04/13  Yes Carmin Muskrat, MD  insulin aspart protamine- aspart (NOVOLOG MIX  70/30) (70-30) 100 UNIT/ML injection Inject 20 Units into the skin 2 (two) times daily with a meal.   Yes Historical Provider, MD  lisinopril (PRINIVIL,ZESTRIL) 40 MG tablet Take 40 mg by mouth daily.   Yes Historical Provider, MD  naproxen (NAPROSYN) 375 MG tablet Take 1 tablet (375 mg total) by mouth 2 (two) times daily. 06/24/16  Yes Etta Quill, NP    No Known Allergies  Social History  Substance Use Topics  . Smoking status: Current Every Day Smoker    Packs/day: 0.20    Types: Cigarettes  . Smokeless tobacco: Never Used  . Alcohol use No    FMH: no history of cirrhosis or liver cancer   Objective:  Constitutional: in no apparent distress and alert,  Vitals:   10/20/16 0931  BP: 136/65  Pulse: 73  Temp: 97.7 F (36.5 C)   Eyes: anicteric Cardiovascular: Cor RRR Respiratory: CTA B; normal respiratory effort Gastrointestinal: Bowel sounds are normal, liver is not enlarged, spleen is not enlarged Musculoskeletal: no pedal edema noted Skin: negatives: no rash; no porphyria cutanea tarda Lymphatic: no cervical lymphadenopathy   Laboratory Genotype:  Lab Results  Component Value Date   HCVGENOTYPE 1a 10/08/2008   HCV viral load:  Lab Results  Component Value Date   HCVQUANT 82400 (H) 10/08/2008   Lab Results  Component Value Date   WBC 9.1 04/04/2016   HGB 14.2 04/04/2016   HCT 42.2 04/04/2016   MCV 87.6 04/04/2016   PLT 248 04/04/2016    Lab Results  Component Value Date   CREATININE 3.14 (H)  04/04/2016   BUN 19 04/04/2016   NA 135 04/04/2016   K 3.9 04/04/2016   CL 98 (L) 04/04/2016   CO2 24 04/04/2016    Lab Results  Component Value Date   ALT 86 (H) 04/04/2016   AST 67 (H) 04/04/2016   ALKPHOS 91 04/04/2016     Labs and history reviewed and show CHILD-PUGH A  5-6 points: Child class A 7-9 points: Child class B 10-15 points: Child class C  Lab Results  Component Value Date   BILITOT 0.7 04/04/2016   ALBUMIN 4.8 04/04/2016      Assessment: New Patient with Chronic Hepatitis C genotype unknown, untreated.  I discussed with the patient the lab findings that confirm chronic hepatitis C as well as the natural history and progression of disease including about 30% of people who develop cirrhosis of the liver if left untreated and once cirrhosis is established there is a 2-7% risk per year of liver cancer and liver failure.  I discussed the importance of treatment and benefits in reducing the risk, even if significant liver fibrosis exists.   Plan: 1) Patient counseled extensively on limiting acetaminophen to no more than 2 grams daily, avoidance of alcohol. 2) Transmission discussed with patient including sexual transmission, sharing razors and toothbrush.   3) Will need referral to gastroenterology if concern for cirrhosis 4) Will need referral for substance abuse counseling: No.; Further work up to include urine drug screen  No. 5) Will prescribe appropriate medication based on genotype and coverage  6) Hepatitis A and B titers 7) Pneumovax vaccine given today 9) Further work up to include liver staging with elastography 10) will follow up after starting medication

## 2016-10-20 NOTE — Patient Instructions (Signed)
Date 10/20/16  Dear Colton Garcia, As discussed in the [facility], your hepatitis C therapy will include highly effective medication(s) for treatment and will vary based on the type of hepatitis C and insurance approval.  Potential medications include:          Harvoni (sofosbuvir 90mg /ledipasvir 400mg ) tablet oral daily          OR     Epclusa (sofosbuvir 400mg /velpatasvir 100mg ) tablet oral daily          OR      Mavyret (glecaprevir 100 mg/pibrentasvir 40 mg): Take 3 tablets oral daily          OR     Zepatier (elbasvir 50 mg/grazoprevir 100 mg) oral daily, +/- ribavirin              Medications are typically for 8 or 12 weeks total ---------------------------------------------------------------- Your HCV Treatment Start Date: You will be notified by our office once the medication is approved and where you can pick it up (or if mailed)   ---------------------------------------------------------------- [place]:   [facility] [place], [place] [address] Phone: [phone] Hours: Monday to Friday 7:30 am to 6:00 pm   Please always contact your pharmacy at least 3-4 business days before you run out of medications to ensure your next month's medication is ready or 1 week prior to running out if you receive it by mail.  Remember, each prescription is for 28 days. ---------------------------------------------------------------- GENERAL NOTES REGARDING YOUR HEPATITIS C MEDICATION:  Some medications have the following interactions:  - Acid reducing agents such as H2 blockers (ie. Pepcid (famotidine), Zantac (ranitidine), Tagamet (cimetidine), Axid (nizatidine) and proton pump inhibitors (ie. Prilosec (omeprazole), Protonix (pantoprazole), Nexium (esomeprazole), or Aciphex (rabeprazole)). Do not take until you have discussed with a health care provider.    -Antacids that contain magnesium and/or aluminum hydroxide (ie. Milk of Magensia, Rolaids,  Gaviscon, Maalox, Mylanta, an dArthritis Pain Formula).  -Calcium carbonate (calcium supplements or antacids such as Tums, Caltrate, Os-Cal).  -St. John's wort or any products that contain St. John's wort like some herbal supplements  Please inform the office prior to starting any of these medications.  - The common side effects associated with Harvoni include:      1. Fatigue      2. Headache      3. Nausea      4. Diarrhea      5. Insomnia  Please note that this only lists the most common side effects and is NOT a comprehensive list of the potential side effects of these medications. For more information, please review the drug information sheets that come with your medication package from the pharmacy.  ---------------------------------------------------------------- GENERAL HELPFUL HINTS ON HCV THERAPY: 1. Stay well-hydrated. 2. Notify the [facility] of any changes in your other over-the-counter/herbal or prescription medications. 3. If you miss a dose of your medication, take the missed dose as soon as you remember. Return to your regular time/dose schedule the next day.  4.  Do not stop taking your medications without first talking with your healthcare provider. 5.  You may take Tylenol (acetaminophen), as long as the dose is less than 2000 mg (OR no more than 4 tablets of the Tylenol Extra Strengths 500mg  tablet) in 24 hours. 6.  You will see our pharmacist-specialist within the first 2 weeks of starting your medication to monitor for any possible side effects. 7.  You will have labs once during treatment, soon  after treatment completion and one final lab 6 months after treatment completion to verify the virus is out of your system.  Colton Garcia, [facility] for [place], [place]  [phone]

## 2016-10-21 LAB — HEPATITIS A ANTIBODY, TOTAL: Hep A Total Ab: REACTIVE — AB

## 2016-10-21 LAB — PROTIME-INR
INR: 1
Prothrombin Time: 11.1 s (ref 9.0–11.5)

## 2016-10-21 LAB — HEPATITIS B CORE ANTIBODY, TOTAL: Hep B Core Total Ab: REACTIVE — AB

## 2016-10-21 LAB — HEPATITIS B SURFACE ANTIGEN: Hepatitis B Surface Ag: NEGATIVE

## 2016-10-21 LAB — HEPATITIS B SURFACE ANTIBODY,QUALITATIVE: HEP B S AB: NEGATIVE

## 2016-10-24 LAB — LIVER FIBROSIS, FIBROTEST-ACTITEST
ALT: 74 U/L — ABNORMAL HIGH (ref 9–46)
APOLIPOPROTEIN A1: 140 mg/dL (ref 94–176)
Alpha-2-Macroglobulin: 306 mg/dL — ABNORMAL HIGH (ref 106–279)
Bilirubin: 0.4 mg/dL (ref 0.2–1.2)
Fibrosis Score: 0.52
GGT: 49 U/L (ref 3–70)
HAPTOGLOBIN: 142 mg/dL (ref 43–212)
Necroinflammat ACT Score: 0.53
REFERENCE ID: 1812824

## 2016-11-01 LAB — HEPATITIS C GENOTYPE

## 2016-11-01 LAB — HCV RNA, QN PCR RFLX GENO, LIPA
HCV RNA, PCR, QN: 5.68 {Log_IU}/mL — AB
HCV RNA, PCR, QN: 474000 IU/mL — ABNORMAL HIGH

## 2016-11-02 ENCOUNTER — Other Ambulatory Visit: Payer: Self-pay | Admitting: Internal Medicine

## 2016-11-02 MED ORDER — LEDIPASVIR-SOFOSBUVIR 90-400 MG PO TABS: 1.0000 | ORAL_TABLET | Freq: Every day | ORAL | 2 refills | Status: DC

## 2016-11-10 ENCOUNTER — Encounter: Payer: Self-pay | Admitting: Pharmacy Technician

## 2016-11-10 MED FILL — HARVONI 90-400 MG TABLET: 90-400 | 28 days supply | Qty: 28 | Fill #0

## 2016-11-16 ENCOUNTER — Ambulatory Visit (HOSPITAL_COMMUNITY)
Admission: RE | Admit: 2016-11-16 | Discharge: 2016-11-16 | Disposition: A | Discharge status: Home / Self Care | Payer: Medicare Other | Source: Ambulatory Visit | Attending: Internal Medicine | Admitting: Internal Medicine

## 2016-11-16 ENCOUNTER — Other Ambulatory Visit (HOSPITAL_COMMUNITY): Payer: Self-pay | Admitting: Internal Medicine

## 2016-11-16 DIAGNOSIS — R918 Other nonspecific abnormal finding of lung field: Secondary | ICD-10-CM | POA: Insufficient documentation

## 2016-11-16 DIAGNOSIS — R059 Cough, unspecified: Secondary | ICD-10-CM

## 2016-11-16 DIAGNOSIS — R05 Cough: Secondary | ICD-10-CM | POA: Insufficient documentation

## 2016-12-06 MED FILL — HARVONI 90-400 MG TABLET: 90-400 | 28 days supply | Qty: 28 | Fill #1

## 2016-12-07 ENCOUNTER — Ambulatory Visit (INDEPENDENT_AMBULATORY_CARE_PROVIDER_SITE_OTHER): Payer: Medicare Other | Admitting: Pharmacist

## 2016-12-07 DIAGNOSIS — B182 Chronic viral hepatitis C: Secondary | ICD-10-CM

## 2016-12-07 NOTE — Progress Notes (Signed)
HPI: Colton Garcia is a 66 y.o. male who presents to the [facility] for follow-up of his Hep C infection.  He has genotype 1a, F2, and started Harvoni x 12 weeks on 11/11/16.   Lab Results  Component Value Date   HCVGENOTYPE 1a 10/20/2016    Allergies: No Known Allergies  Past Medical History: Past Medical History:  Diagnosis Date  . Chronic pancreatitis ([place])   . Diabetes mellitus   . Hypertension     Social History: Social History   Social History  . Marital status: Divorced    Spouse name: N/A  . Number of children: N/A  . Years of education: N/A   Social History Main Topics  . Smoking status: Current Every Day Smoker    Packs/day: 0.20    Types: Cigarettes  . Smokeless tobacco: Never Used  . Alcohol use No  . Drug use: No     Comment: last used 2015  . Sexual activity: Not on file   Other Topics Concern  . Not on file   Social History Narrative  . No narrative on file    Labs: Hep B S Ab (no units)  Date Value  10/20/2016 NEG   Hepatitis B Surface Ag (no units)  Date Value  10/20/2016 NEGATIVE   HCV Ab (no units)  Date Value  07/31/2008 REACTIVE (A)    Lab Results  Component Value Date   HCVGENOTYPE 1a 10/20/2016    Hepatitis C RNA quantitative Latest Ref Rng & Units 10/08/2008 09/27/2008  HCV Quantitative <43 intl units/mL 82400(H) TNP IU/mL    AST (U/L)  Date Value  10/20/2016 58 (H)  04/04/2016 67 (H)  04/10/2011 75 (H)   ALT (U/L)  Date Value  10/20/2016 74 (H)  10/20/2016 76 (H)  04/04/2016 86 (H)  04/10/2011 129 (H)   INR (no units)  Date Value  10/20/2016 1.0    CrCl: CrCl cannot be calculated (Patient's most recent lab result is older than the maximum 21 days allowed.).  Fibrosis Score: F2 as assessed by fibrosis   Child-Pugh Score: A  Previous Treatment Regimen: None  Assessment: Colton Garcia is here today for Hep C follow-up.  He started Harvoni at the beginning of this month and has had no issues  tolerating it - no nausea, vomiting, diarrhea, headaches, or insomnia. He does complain of a mucous producing cough. He had a chest xray at his PCP which did not show any pneumonia or infectious processes.  He is following back up soon with them.  He does admit to missing the 2nd day's dose because he forgot to take it to work with him.  He takes it at 1pm everyday.  I reinforced the importance of not missing any doses and explained his F score to him.  He will follow-up with me at EOT.  I will make his cure visit at that time.    Plans: - Continue Harvoni x 12 weeks - Hep C VL today - F/u with me again at EOT 6/4 at [place]. Kuppelweiser, PharmD, [place] [facility] 12/07/2016, 10:06 AM

## 2016-12-09 LAB — HEPATITIS C RNA QUANTITATIVE
HCV Quantitative Log: <1.18 Log IU/mL
HCV Quantitative: <15 IU/mL

## 2017-01-03 MED FILL — HARVONI 90-400 MG TABLET: 90-400 | 28 days supply | Qty: 28 | Fill #2

## 2017-02-14 ENCOUNTER — Ambulatory Visit (INDEPENDENT_AMBULATORY_CARE_PROVIDER_SITE_OTHER): Payer: Medicare Other | Admitting: Pharmacist

## 2017-02-14 DIAGNOSIS — B182 Chronic viral hepatitis C: Secondary | ICD-10-CM
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
## 2017-02-16 LAB — HEPATITIS C RNA QUANTITATIVE
HCV QUANT LOG: NOT DETECTED {Log_IU}/mL
HCV QUANT: NOT DETECTED [IU]/mL

## 2017-05-23 ENCOUNTER — Other Ambulatory Visit: Payer: Medicare Other

## 2017-05-23 DIAGNOSIS — B182 Chronic viral hepatitis C: Secondary | ICD-10-CM

## 2017-05-25 LAB — HEPATITIS C RNA QUANTITATIVE
HCV Quantitative Log: <1.18 Log IU/mL
HCV RNA, PCR, QN: NOT DETECTED [IU]/mL

## 2017-05-30 ENCOUNTER — Ambulatory Visit (INDEPENDENT_AMBULATORY_CARE_PROVIDER_SITE_OTHER): Payer: Medicare Other | Admitting: Pharmacist

## 2017-05-30 DIAGNOSIS — B182 Chronic viral hepatitis C: Secondary | ICD-10-CM

## 2017-05-30 NOTE — Progress Notes (Signed)
HPI: Colton Garcia is a 66 y.o. male who presents to the [facility] for his Hep C cure visit.  He has genotype 1a, F2, and completed 12 weeks of Harvoni at the end of May.  Lab Results  Component Value Date   HCVGENOTYPE 1a 10/20/2016    Allergies: No Known Allergies  Past Medical History: Past Medical History:  Diagnosis Date  . Chronic pancreatitis ([place])   . Diabetes mellitus   . Hypertension     Social History: Social History   Social History  . Marital status: Divorced    Spouse name: N/A  . Number of children: N/A  . Years of education: N/A   Social History Main Topics  . Smoking status: Current Every Day Smoker    Packs/day: 0.20    Types: Cigarettes  . Smokeless tobacco: Never Used  . Alcohol use No  . Drug use: No     Comment: last used 2015  . Sexual activity: Not on file   Other Topics Concern  . Not on file   Social History Narrative  . No narrative on file    Labs: Hep B S Ab (no units)  Date Value  10/20/2016 NEG   Hepatitis B Surface Ag (no units)  Date Value  10/20/2016 NEGATIVE   HCV Ab (no units)  Date Value  07/31/2008 REACTIVE (A)    Lab Results  Component Value Date   HCVGENOTYPE 1a 10/20/2016    Hepatitis C RNA quantitative Latest Ref Rng & Units 05/23/2017 02/14/2017 12/07/2016 10/08/2008 09/27/2008  HCV Quantitative NOT DETECTED IU/mL - <15 NOT DETECTED <15 NOT DETECTED 82400(H) TNP IU/mL  HCV Quantitative Log NOT DETECT Log IU/mL <1.18 NOT DETECTED <1.18 NOT DETECTED <1.18 NOT DETECTED - -    AST (U/L)  Date Value  10/20/2016 58 (H)  04/04/2016 67 (H)  04/10/2011 75 (H)   ALT (U/L)  Date Value  10/20/2016 74 (H)  10/20/2016 76 (H)  04/04/2016 86 (H)  04/10/2011 129 (H)   INR (no units)  Date Value  10/20/2016 1.0    CrCl: CrCl cannot be calculated (Patient's most recent lab result is older than the maximum 21 days allowed.).  Fibrosis Score: F2 as assessed by fibrosure   Child-Pugh  Score: A  Previous Treatment Regimen: None  Assessment: Colton Garcia is here today for his Hep C cure visit. He successfully completed 12 weeks of Harvoni ~3 months ago. He only missed one dose during the 12 weeks and had no issues tolerating the medication. His early on treatment viral load was undetectable and so was his end of treatment viral load. He had his SVR12 viral load drawn last week and was also undetectable. He is cured.  Relayed the news to him and congratulated him. Educated him on the importance of not engaging in risky behavior as he could get reinfected.  Also educated on alcohol consumption.  Told him to let me know if he needs anything in the future.    Plans: - Cured of Hep C - RTC PRN  Liridona Mashaw L. Summer Parthasarathy, PharmD, [place] for [facility] 05/30/2017, 10:22 AM

## 2017-11-26 ENCOUNTER — Encounter (HOSPITAL_COMMUNITY): Payer: Self-pay | Admitting: Emergency Medicine

## 2017-11-26 ENCOUNTER — Emergency Department (HOSPITAL_COMMUNITY)
Admission: EM | Admit: 2017-11-26 | Discharge: 2017-11-26 | Disposition: A | Discharge status: Home / Self Care | Payer: Medicare Other | Attending: Emergency Medicine | Admitting: Emergency Medicine

## 2017-11-26 ENCOUNTER — Other Ambulatory Visit: Payer: Self-pay

## 2017-11-26 ENCOUNTER — Emergency Department (HOSPITAL_COMMUNITY): Payer: Medicare Other

## 2017-11-26 DIAGNOSIS — Z79899 Other long term (current) drug therapy: Secondary | ICD-10-CM | POA: Insufficient documentation

## 2017-11-26 DIAGNOSIS — R1084 Generalized abdominal pain: Secondary | ICD-10-CM | POA: Diagnosis present

## 2017-11-26 DIAGNOSIS — E119 Type 2 diabetes mellitus without complications: Secondary | ICD-10-CM | POA: Diagnosis not present

## 2017-11-26 DIAGNOSIS — R112 Nausea with vomiting, unspecified: Secondary | ICD-10-CM | POA: Insufficient documentation

## 2017-11-26 DIAGNOSIS — Z794 Long term (current) use of insulin: Secondary | ICD-10-CM | POA: Insufficient documentation

## 2017-11-26 DIAGNOSIS — F17210 Nicotine dependence, cigarettes, uncomplicated: Secondary | ICD-10-CM | POA: Diagnosis not present

## 2017-11-26 DIAGNOSIS — I10 Essential (primary) hypertension: Secondary | ICD-10-CM | POA: Diagnosis not present

## 2017-11-26 LAB — COMPREHENSIVE METABOLIC PANEL
ALT: 21 U/L (ref 17–63)
AST: 24 U/L (ref 15–41)
Albumin: 4.5 g/dL (ref 3.5–5.0)
Alkaline Phosphatase: 82 U/L (ref 38–126)
Anion gap: 11 (ref 5–15)
BILIRUBIN TOTAL: 0.9 mg/dL (ref 0.3–1.2)
BUN: 16 mg/dL (ref 6–20)
CO2: 27 mmol/L (ref 22–32)
CREATININE: 1.21 mg/dL (ref 0.61–1.24)
Calcium: 9.6 mg/dL (ref 8.9–10.3)
Chloride: 104 mmol/L (ref 101–111)
Glucose, Bld: 225 mg/dL — ABNORMAL HIGH (ref 65–99)
POTASSIUM: 4.4 mmol/L (ref 3.5–5.1)
Sodium: 142 mmol/L (ref 135–145)
TOTAL PROTEIN: 7.8 g/dL (ref 6.5–8.1)

## 2017-11-26 LAB — CBC WITH DIFFERENTIAL/PLATELET
BASOS ABS: 0 10*3/uL (ref 0.0–0.1)
Basophils Relative: 0 %
Eosinophils Absolute: 0 10*3/uL (ref 0.0–0.7)
Eosinophils Relative: 0 %
HEMATOCRIT: 46 % (ref 39.0–52.0)
Hemoglobin: 15 g/dL (ref 13.0–17.0)
LYMPHS ABS: 0.4 10*3/uL — AB (ref 0.7–4.0)
LYMPHS PCT: 4 %
MCH: 29.2 pg (ref 26.0–34.0)
MCHC: 32.6 g/dL (ref 30.0–36.0)
MCV: 89.5 fL (ref 78.0–100.0)
MONO ABS: 0.4 10*3/uL (ref 0.1–1.0)
MONOS PCT: 4 %
NEUTROS ABS: 8.9 10*3/uL — AB (ref 1.7–7.7)
Neutrophils Relative %: 92 %
Platelets: 273 10*3/uL (ref 150–400)
RBC: 5.14 MIL/uL (ref 4.22–5.81)
RDW: 14.2 % (ref 11.5–15.5)
WBC: 9.7 10*3/uL (ref 4.0–10.5)

## 2017-11-26 LAB — URINALYSIS, ROUTINE W REFLEX MICROSCOPIC
Bacteria, UA: NONE SEEN
Bilirubin Urine: NEGATIVE
Glucose, UA: 50 mg/dL — AB
Ketones, ur: 5 mg/dL — AB
LEUKOCYTES UA: NEGATIVE
Nitrite: NEGATIVE
PH: 5 (ref 5.0–8.0)
Protein, ur: NEGATIVE mg/dL
SPECIFIC GRAVITY, URINE: 1.039 — AB (ref 1.005–1.030)
SQUAMOUS EPITHELIAL / LPF: NONE SEEN

## 2017-11-26 LAB — LIPASE, BLOOD: LIPASE: 33 U/L (ref 11–51)

## 2017-11-26 MED ORDER — IOPAMIDOL (ISOVUE-300) INJECTION 61%
INTRAVENOUS | Status: AC
  Filled 2017-11-26: qty 100

## 2017-11-26 MED ORDER — ONDANSETRON 4 MG PO TBDP: 4.0000 mg | ORAL_TABLET | Freq: Three times a day (TID) | ORAL | 0 refills | Status: DC | PRN

## 2017-11-26 MED ORDER — HYDROMORPHONE HCL 1 MG/ML IJ SOLN
1.0000 mg | Freq: Once | INTRAMUSCULAR | Status: AC
  Administered 2017-11-26: 1 mg via INTRAVENOUS
  Filled 2017-11-26: qty 1

## 2017-11-26 MED ORDER — SODIUM CHLORIDE 0.9 % IV BOLUS (SEPSIS)
1000.0000 mL | Freq: Once | INTRAVENOUS | Status: AC
  Administered 2017-11-26: 1000 mL via INTRAVENOUS

## 2017-11-26 MED ORDER — IOPAMIDOL (ISOVUE-300) INJECTION 61%
100.0000 mL | Freq: Once | INTRAVENOUS | Status: AC | PRN
  Administered 2017-11-26: 100 mL via INTRAVENOUS
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
## 2017-11-26 NOTE — ED Notes (Signed)
Patient given ginger ale and tolerate well so far.

## 2017-11-26 NOTE — ED Triage Notes (Signed)
Pt BIB EMS from home with complaints of abd pain n/v since 0200. Patient has chronic pancreatitis. Patient given 4 zofran IV

## 2017-11-26 NOTE — Discharge Instructions (Signed)
Return to the ED with any concerns including vomiting and not able to keep down liquids or your medications, abdominal pain especially if it localizes to the right lower abdomen, fever or chills, and decreased urine output, decreased level of alertness or lethargy, or any other alarming symptoms.  °

## 2019-08-19 ENCOUNTER — Other Ambulatory Visit: Payer: Self-pay

## 2019-08-19 ENCOUNTER — Emergency Department (HOSPITAL_COMMUNITY): Payer: Medicare Other

## 2019-08-19 ENCOUNTER — Emergency Department (HOSPITAL_COMMUNITY)
Admission: EM | Admit: 2019-08-19 | Discharge: 2019-08-19 | Disposition: A | Discharge status: Home / Self Care | Payer: Medicare Other | Attending: Emergency Medicine | Admitting: Emergency Medicine

## 2019-08-19 ENCOUNTER — Encounter (HOSPITAL_COMMUNITY): Payer: Self-pay

## 2019-08-19 DIAGNOSIS — M25571 Pain in right ankle and joints of right foot: Secondary | ICD-10-CM | POA: Diagnosis present

## 2019-08-19 DIAGNOSIS — Z794 Long term (current) use of insulin: Secondary | ICD-10-CM | POA: Diagnosis not present

## 2019-08-19 DIAGNOSIS — Z79899 Other long term (current) drug therapy: Secondary | ICD-10-CM | POA: Insufficient documentation

## 2019-08-19 DIAGNOSIS — M7661 Achilles tendinitis, right leg: Secondary | ICD-10-CM | POA: Insufficient documentation

## 2019-08-19 DIAGNOSIS — F17210 Nicotine dependence, cigarettes, uncomplicated: Secondary | ICD-10-CM | POA: Diagnosis not present

## 2019-08-19 DIAGNOSIS — I10 Essential (primary) hypertension: Secondary | ICD-10-CM | POA: Insufficient documentation

## 2019-08-19 DIAGNOSIS — E119 Type 2 diabetes mellitus without complications: Secondary | ICD-10-CM | POA: Insufficient documentation

## 2019-08-19 DIAGNOSIS — M7660 Achilles tendinitis, unspecified leg: Secondary | ICD-10-CM

## 2019-08-19 MED ORDER — DOXYCYCLINE HYCLATE 100 MG PO CAPS: 100.0000 mg | ORAL_CAPSULE | Freq: Two times a day (BID) | ORAL | 0 refills | Status: DC

## 2019-08-19 NOTE — ED Notes (Signed)
Ortho at bedside.

## 2019-08-19 NOTE — ED Notes (Signed)
Pt verbalizes understanding of DC instructions. Pt belongings returned and is ambulatory out of ED.  
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
## 2019-08-19 NOTE — ED Triage Notes (Signed)
Pt presents with c/o pain in his right foot since Friday night. Pt denies any injury to that area. Pt reports the pain is in his heel, unable to put pressure on it.

## 2019-08-19 NOTE — Discharge Instructions (Addendum)
You were seen in the ER for right posterior ankle pain.  Ultrasound showed a round cystlike lesion.  X-ray showed inflammation of your Achilles tendon in that area.  I suspect your symptoms are from inflammation of the Achilles tendon or possibly an inflamed cyst in the tendon.  This does not look like an abscess, boil or infection however given your risk we will treat with doxycycline.  Avoid hightop shoes.  Rest.  Elevate your foot.  Alternate ibuprofen and acetaminophen for pain.  Apply ice to the area.

## 2020-03-31 ENCOUNTER — Other Ambulatory Visit: Payer: Self-pay | Admitting: Gastroenterology

## 2020-04-15 ENCOUNTER — Emergency Department (HOSPITAL_COMMUNITY): Payer: Medicare Other

## 2020-04-15 ENCOUNTER — Emergency Department (HOSPITAL_COMMUNITY)
Admission: EM | Admit: 2020-04-15 | Discharge: 2020-04-15 | Disposition: A | Discharge status: Home / Self Care | Payer: Medicare Other | Attending: Emergency Medicine | Admitting: Emergency Medicine

## 2020-04-15 ENCOUNTER — Other Ambulatory Visit: Payer: Self-pay

## 2020-04-15 DIAGNOSIS — R0981 Nasal congestion: Secondary | ICD-10-CM | POA: Insufficient documentation

## 2020-04-15 DIAGNOSIS — E119 Type 2 diabetes mellitus without complications: Secondary | ICD-10-CM | POA: Diagnosis not present

## 2020-04-15 DIAGNOSIS — R05 Cough: Secondary | ICD-10-CM | POA: Diagnosis present

## 2020-04-15 DIAGNOSIS — R5383 Other fatigue: Secondary | ICD-10-CM | POA: Insufficient documentation

## 2020-04-15 DIAGNOSIS — Z794 Long term (current) use of insulin: Secondary | ICD-10-CM | POA: Diagnosis not present

## 2020-04-15 DIAGNOSIS — Z20822 Contact with and (suspected) exposure to covid-19: Secondary | ICD-10-CM | POA: Insufficient documentation

## 2020-04-15 DIAGNOSIS — F17210 Nicotine dependence, cigarettes, uncomplicated: Secondary | ICD-10-CM | POA: Insufficient documentation

## 2020-04-15 DIAGNOSIS — I10 Essential (primary) hypertension: Secondary | ICD-10-CM | POA: Diagnosis not present

## 2020-04-15 DIAGNOSIS — Z79899 Other long term (current) drug therapy: Secondary | ICD-10-CM | POA: Insufficient documentation

## 2020-04-15 DIAGNOSIS — J069 Acute upper respiratory infection, unspecified: Secondary | ICD-10-CM | POA: Diagnosis not present

## 2020-04-15 DIAGNOSIS — M7918 Myalgia, other site: Secondary | ICD-10-CM | POA: Diagnosis not present

## 2020-04-15 DIAGNOSIS — R197 Diarrhea, unspecified: Secondary | ICD-10-CM | POA: Insufficient documentation

## 2020-04-15 LAB — SARS CORONAVIRUS 2 BY RT PCR (HOSPITAL ORDER, PERFORMED IN ~~LOC~~ HOSPITAL LAB): SARS Coronavirus 2: NEGATIVE

## 2020-04-15 MED ORDER — ALBUTEROL SULFATE HFA 108 (90 BASE) MCG/ACT IN AERS
2.0000 | INHALATION_SPRAY | Freq: Four times a day (QID) | RESPIRATORY_TRACT | Status: DC | PRN
  Administered 2020-04-15: 2 via RESPIRATORY_TRACT
  Filled 2020-04-15: qty 6.7

## 2020-04-15 MED ORDER — BENZONATATE 100 MG PO CAPS: 100.0000 mg | ORAL_CAPSULE | Freq: Three times a day (TID) | ORAL | 0 refills | Status: DC

## 2020-04-15 NOTE — Discharge Instructions (Signed)
Follow-up with your Covid tests results tomorrow. If your Covid test is positive, you need to quarantine for at least 7 days but your symptoms also need to be improving and you need to be fever free for 48 hours. If your Covid test is negative, you still need to be fever free for 48 hours prior to returning to work. Return to emergency room if you have any worsening symptoms including worsening shortness of breath.

## 2020-04-15 NOTE — ED Notes (Signed)
Pt is actively cough with mild nasal congestion. Otherwise no other complaints at this time

## 2020-04-15 NOTE — ED Triage Notes (Signed)
Patient states he has been feeling weak with no energy, having loss of appetite since Saturday. Unable to eat a lot Night sweats  Patient has covid vaccinations
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
## 2020-05-20 ENCOUNTER — Other Ambulatory Visit (HOSPITAL_COMMUNITY)
Admission: RE | Admit: 2020-05-20 | Discharge: 2020-05-20 | Disposition: A | Discharge status: Home / Self Care | Payer: Medicare Other | Source: Ambulatory Visit | Attending: Gastroenterology | Admitting: Gastroenterology

## 2020-05-20 DIAGNOSIS — Z01812 Encounter for preprocedural laboratory examination: Secondary | ICD-10-CM | POA: Insufficient documentation

## 2020-05-20 DIAGNOSIS — Z20822 Contact with and (suspected) exposure to covid-19: Secondary | ICD-10-CM | POA: Insufficient documentation

## 2020-05-20 LAB — SARS CORONAVIRUS 2 (TAT 6-24 HRS): SARS Coronavirus 2: NEGATIVE

## 2020-05-23 ENCOUNTER — Ambulatory Visit (HOSPITAL_COMMUNITY)
Admission: RE | Admit: 2020-05-23 | Discharge: 2020-05-23 | Disposition: A | Discharge status: Home / Self Care | Payer: Medicare Other | Attending: Gastroenterology | Admitting: Gastroenterology

## 2020-05-23 ENCOUNTER — Other Ambulatory Visit: Payer: Self-pay

## 2020-05-23 ENCOUNTER — Encounter (HOSPITAL_COMMUNITY)
Admission: RE | Disposition: A | Discharge status: Home / Self Care | Payer: Self-pay | Source: Home / Self Care | Attending: Gastroenterology

## 2020-05-23 ENCOUNTER — Ambulatory Visit (HOSPITAL_COMMUNITY): Payer: Medicare Other | Admitting: Certified Registered Nurse Anesthetist

## 2020-05-23 ENCOUNTER — Encounter (HOSPITAL_COMMUNITY): Payer: Self-pay | Admitting: Gastroenterology

## 2020-05-23 DIAGNOSIS — I10 Essential (primary) hypertension: Secondary | ICD-10-CM | POA: Diagnosis not present

## 2020-05-23 DIAGNOSIS — F17210 Nicotine dependence, cigarettes, uncomplicated: Secondary | ICD-10-CM | POA: Diagnosis not present

## 2020-05-23 DIAGNOSIS — E119 Type 2 diabetes mellitus without complications: Secondary | ICD-10-CM | POA: Insufficient documentation

## 2020-05-23 DIAGNOSIS — K861 Other chronic pancreatitis: Secondary | ICD-10-CM | POA: Diagnosis not present

## 2020-05-23 DIAGNOSIS — D124 Benign neoplasm of descending colon: Secondary | ICD-10-CM | POA: Insufficient documentation

## 2020-05-23 DIAGNOSIS — K635 Polyp of colon: Secondary | ICD-10-CM | POA: Diagnosis present

## 2020-05-23 DIAGNOSIS — Z9049 Acquired absence of other specified parts of digestive tract: Secondary | ICD-10-CM | POA: Insufficient documentation

## 2020-05-23 HISTORY — PX: SUBMUCOSAL LIFTING INJECTION: SHX6855

## 2020-05-23 HISTORY — PX: COLONOSCOPY WITH PROPOFOL: SHX5780

## 2020-05-23 HISTORY — PX: POLYPECTOMY: SHX5525

## 2020-05-23 HISTORY — PX: HEMOSTASIS CLIP PLACEMENT: SHX6857

## 2020-05-23 LAB — GLUCOSE, CAPILLARY: Glucose-Capillary: 90 mg/dL (ref 70–99)

## 2020-05-23 SURGERY — COLONOSCOPY WITH PROPOFOL
Anesthesia: Monitor Anesthesia Care

## 2020-05-23 MED ORDER — PROPOFOL 10 MG/ML IV BOLUS
INTRAVENOUS | Status: DC | PRN
  Administered 2020-05-23 (×2): 20 mg via INTRAVENOUS
  Administered 2020-05-23: 40 mg via INTRAVENOUS

## 2020-05-23 MED ORDER — PROPOFOL 10 MG/ML IV BOLUS
INTRAVENOUS | Status: AC
  Filled 2020-05-23: qty 20

## 2020-05-23 MED ORDER — SODIUM CHLORIDE 0.9 % IV SOLN: INTRAVENOUS | Status: DC

## 2020-05-23 MED ORDER — LACTATED RINGERS IV SOLN: INTRAVENOUS | Status: DC

## 2020-05-23 MED ORDER — PROPOFOL 500 MG/50ML IV EMUL
INTRAVENOUS | Status: AC
  Filled 2020-05-23: qty 50

## 2020-05-23 MED ORDER — LIDOCAINE 2% (20 MG/ML) 5 ML SYRINGE
INTRAMUSCULAR | Status: DC | PRN
  Administered 2020-05-23: 60 mg via INTRAVENOUS

## 2020-05-23 MED ORDER — PROPOFOL 500 MG/50ML IV EMUL
INTRAVENOUS | Status: DC | PRN
  Administered 2020-05-23: 125 ug/kg/min via INTRAVENOUS

## 2020-05-23 SURGICAL SUPPLY — 22 items

## 2020-05-23 NOTE — Anesthesia Preprocedure Evaluation (Addendum)
Anesthesia Evaluation  Patient identified by MRN, date of birth, ID band  Reviewed: Allergy & Precautions, NPO status , Patient's Chart, lab work & pertinent test results  Airway Mallampati: II  TM Distance: >3 FB Neck ROM: Full    Dental  (+) Teeth Intact   Pulmonary Current Smoker,    Pulmonary exam normal        Cardiovascular Exercise Tolerance: Good hypertension, Normal cardiovascular exam Rhythm:Regular Rate:Normal     Neuro/Psych  Headaches, negative psych ROS   GI/Hepatic Chronic pancreatitis Colon polyp   Endo/Other  diabetes  Renal/GU negative Renal ROS  negative genitourinary   Musculoskeletal negative musculoskeletal ROS (+)   Abdominal Normal abdominal exam  (+)   Peds  Hematology   Anesthesia Other Findings   Reproductive/Obstetrics                            Anesthesia Physical Anesthesia Plan  ASA: III  Anesthesia Plan: MAC   Post-op Pain Management:    Induction:   PONV Risk Score and Plan: 0 and Propofol infusion, TIVA and Treatment may vary due to age or medical condition  Airway Management Planned: Simple Face Mask  Additional Equipment:   Intra-op Plan:   Post-operative Plan:   Informed Consent: I have reviewed the patients History and Physical, chart, labs and discussed the procedure including the risks, benefits and alternatives for the proposed anesthesia with the patient or authorized representative who has indicated his/her understanding and acceptance.       Plan Discussed with:   Anesthesia Plan Comments:         Anesthesia Quick Evaluation

## 2020-05-23 NOTE — Transfer of Care (Signed)
Immediate Anesthesia Transfer of Care Note  Patient: Colton Garcia  Procedure(s) Performed: COLONOSCOPY WITH PROPOFOL (N/A ) SUBMUCOSAL LIFTING INJECTION POLYPECTOMY HEMOSTASIS CLIP PLACEMENT  Patient Location: PACU  Anesthesia Type:MAC  Level of Consciousness: drowsy and patient cooperative  Airway & Oxygen Therapy: Patient Spontanous Breathing and Patient connected to face mask oxygen  Post-op Assessment: Report given to RN and Post -op Vital signs reviewed and stable  Post vital signs: Reviewed and stable  Last Vitals:  Vitals Value Taken Time  BP 127/68   Temp    Pulse 64   Resp 14   SpO2 100%     Last Pain:  Vitals:   05/23/20 0629  PainSc: 0-No pain         Complications: No complications documented.

## 2020-05-23 NOTE — Op Note (Signed)
[facility] Patient Name: Colton Garcia Procedure Date: 05/23/2020 MRN: [id number] Attending MD: Carol Ada , MD Date of Birth: November 27, 1950 CSN: [id number] Age: 69 Admit Type: Outpatient Procedure:                Colonoscopy Indications:              Therapeutic procedure for colon polyps Providers:                Carol Ada, MD, Doristine Johns, RN, William Dalton, Technician Referring MD:              Medicines:                Propofol per Anesthesia Complications:            No immediate complications. Estimated Blood Loss:     Estimated blood loss was minimal. Procedure:                Pre-Anesthesia Assessment:                           - Prior to the procedure, a History and Physical                            was performed, and patient medications and                            allergies were reviewed. The patient's tolerance of                            previous anesthesia was also reviewed. The risks                            and benefits of the procedure and the sedation                            options and risks were discussed with the patient.                            All questions were answered, and informed consent                            was obtained. Prior Anticoagulants: The patient has                            taken no previous anticoagulant or antiplatelet                            agents. ASA Grade Assessment: II - A patient with                            mild systemic disease. After reviewing the risks  and benefits, the patient was deemed in                            satisfactory condition to undergo the procedure.                           - Sedation was administered by an anesthesia                            professional. Deep sedation was attained.                           After obtaining informed consent, the colonoscope                            was passed under  direct vision. Throughout the                            procedure, the patient's blood pressure, pulse, and                            oxygen saturations were monitored continuously. The                            CF-HQ190L ([id number] Olympus colonoscope was                            introduced through the anus and advanced to the the                            cecum, identified by appendiceal orifice and                            ileocecal valve. The colonoscopy was performed                            without difficulty. The patient tolerated the                            procedure well. The quality of the bowel                            preparation was good. The ileocecal valve,                            appendiceal orifice, and rectum were photographed. Scope In: 7:36:45 AM Scope Out: 8:17:48 AM Scope Withdrawal Time: 0 hours 28 minutes 7 seconds  Total Procedure Duration: 0 hours 41 minutes 3 seconds  Findings:      A 20 mm polyp was found in the descending colon. The polyp was sessile.       The polyp was removed with a hot snare. The polyp was removed with a       saline injection-lift technique using a hot snare. The polyp was removed       with a piecemeal  technique using a hot snare. Resection and retrieval       were complete. To prevent bleeding after the polypectomy, five       hemostatic clips were successfully placed (MR unsafe). There was no       bleeding at the end of the procedure.      The large mid descending colon polyp was again identified. There was a       central depression, but with a 10 ml sequential injection of O'rise, the       lesion lifted and the central depression disappeared. Piecemeal snare       cautery was used and an excellent resection was achieved. The edges were       clear of any residual polyp. The mucosal defect was closed with a total       of 5 hemoclips. Minimal bleeding occurred during the polypectomy. Impression:               - One  20 mm polyp in the descending colon, removed                            with a hot snare, removed using injection-lift and                            a hot snare and removed piecemeal using a hot                            snare. Resected and retrieved. Clips (MR unsafe)                            were placed. Moderate Sedation:      Not Applicable - Patient had care per Anesthesia. Recommendation:           - Patient has a contact number available for                            emergencies. The signs and symptoms of potential                            delayed complications were discussed with the                            patient. Return to normal activities tomorrow.                            Written discharge instructions were provided to the                            patient.                           - Resume previous diet.                           - Continue present medications.                           - Await pathology results.                           -  Repeat colonoscopy in 1 year for surveillance. Procedure Code(s):        --- Professional ---                           [phone], Colonoscopy, flexible; with removal of                            tumor(s), polyp(s), or other lesion(s) by snare                            technique                           [id number], Colonoscopy, flexible; with directed                            submucosal injection(s), any substance Diagnosis Code(s):        --- Professional ---                           K63.5, Polyp of colon CPT copyright 2019 American Medical Association. All rights reserved. The codes documented in this report are preliminary and upon coder review may  be revised to meet current compliance requirements. Carol Ada, MD Carol Ada, MD 05/23/2020 8:25:00 AM This report has been signed electronically. Number of Addenda: 0

## 2020-05-23 NOTE — Discharge Instructions (Signed)
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
## 2020-05-23 NOTE — Anesthesia Postprocedure Evaluation (Signed)
Anesthesia Post Note  Patient: Colton Garcia  Procedure(s) Performed: COLONOSCOPY WITH PROPOFOL (N/A ) SUBMUCOSAL LIFTING INJECTION POLYPECTOMY HEMOSTASIS CLIP PLACEMENT     Patient location during evaluation: PACU Anesthesia Type: MAC Level of consciousness: awake and alert Pain management: pain level controlled Vital Signs Assessment: post-procedure vital signs reviewed and stable Respiratory status: spontaneous breathing and respiratory function stable Cardiovascular status: stable Postop Assessment: no apparent nausea or vomiting Anesthetic complications: no   No complications documented.  Last Vitals:  Vitals:   05/23/20 0830 05/23/20 0835  BP: (!) 151/91 (!) 175/73  Pulse: 68 (!) 54  Resp: 13 13  SpO2: 100% 100%    Last Pain:  Vitals:   05/23/20 0835  PainSc: 0-No pain                 Merlinda Frederick

## 2020-05-26 LAB — SURGICAL PATHOLOGY

## 2020-05-27 ENCOUNTER — Encounter (HOSPITAL_COMMUNITY): Payer: Self-pay | Admitting: Gastroenterology

## 2020-05-27 NOTE — Progress Notes (Signed)
Attempted, unable to leave message/bt 

## 2020-06-02 ENCOUNTER — Ambulatory Visit
Admission: RE | Admit: 2020-06-02 | Discharge: 2020-06-02 | Disposition: A | Discharge status: Home / Self Care | Payer: Medicare Other | Source: Ambulatory Visit | Attending: Family Medicine | Admitting: Family Medicine

## 2020-06-02 ENCOUNTER — Other Ambulatory Visit: Payer: Self-pay | Admitting: Family Medicine

## 2020-06-02 DIAGNOSIS — Z720 Tobacco use: Secondary | ICD-10-CM

## 2020-06-02 DIAGNOSIS — R053 Chronic cough: Secondary | ICD-10-CM

## 2020-07-17 ENCOUNTER — Encounter (HOSPITAL_COMMUNITY): Payer: Self-pay | Admitting: Emergency Medicine

## 2020-07-17 ENCOUNTER — Other Ambulatory Visit: Payer: Self-pay

## 2020-07-17 ENCOUNTER — Emergency Department (HOSPITAL_COMMUNITY)
Admission: EM | Admit: 2020-07-17 | Discharge: 2020-07-17 | Disposition: A | Discharge status: Home / Self Care | Payer: Medicare Other | Attending: Emergency Medicine | Admitting: Emergency Medicine

## 2020-07-17 DIAGNOSIS — F17210 Nicotine dependence, cigarettes, uncomplicated: Secondary | ICD-10-CM | POA: Insufficient documentation

## 2020-07-17 DIAGNOSIS — X500XXA Overexertion from strenuous movement or load, initial encounter: Secondary | ICD-10-CM | POA: Diagnosis not present

## 2020-07-17 DIAGNOSIS — Y990 Civilian activity done for income or pay: Secondary | ICD-10-CM | POA: Insufficient documentation

## 2020-07-17 DIAGNOSIS — M545 Low back pain, unspecified: Secondary | ICD-10-CM | POA: Insufficient documentation

## 2020-07-17 DIAGNOSIS — Z79899 Other long term (current) drug therapy: Secondary | ICD-10-CM | POA: Insufficient documentation

## 2020-07-17 DIAGNOSIS — E119 Type 2 diabetes mellitus without complications: Secondary | ICD-10-CM | POA: Diagnosis not present

## 2020-07-17 DIAGNOSIS — I10 Essential (primary) hypertension: Secondary | ICD-10-CM | POA: Insufficient documentation

## 2020-07-17 MED ORDER — CYCLOBENZAPRINE HCL 10 MG PO TABS: 10.0000 mg | ORAL_TABLET | Freq: Two times a day (BID) | ORAL | 0 refills | Status: DC | PRN

## 2020-07-17 MED ORDER — KETOROLAC TROMETHAMINE 15 MG/ML IJ SOLN
15.0000 mg | Freq: Once | INTRAMUSCULAR | Status: AC
  Administered 2020-07-17: 15 mg via INTRAMUSCULAR
  Filled 2020-07-17: qty 1

## 2020-07-17 NOTE — ED Triage Notes (Signed)
Pt reports at work the other day a machine broke down at work and they had to do manual lifting. Reports lower back pain for past 2-3 days. Denies any falls or injuries.
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
## 2020-07-17 NOTE — Discharge Instructions (Addendum)
Please return for any problem.   Continue to use Naproxen for pain.  Trial Flexeril as prescribed for muscle spasm.

## 2020-11-01 IMAGING — DX DG ANKLE COMPLETE 3+V*R*
3 series · 3 of 3 positions shown · non-contrast
Comparison: 11/18/2012

CLINICAL DATA: Focal edema and tenderness.

EXAM:
RIGHT ANKLE - COMPLETE 3+ VIEW

[ankle ap]
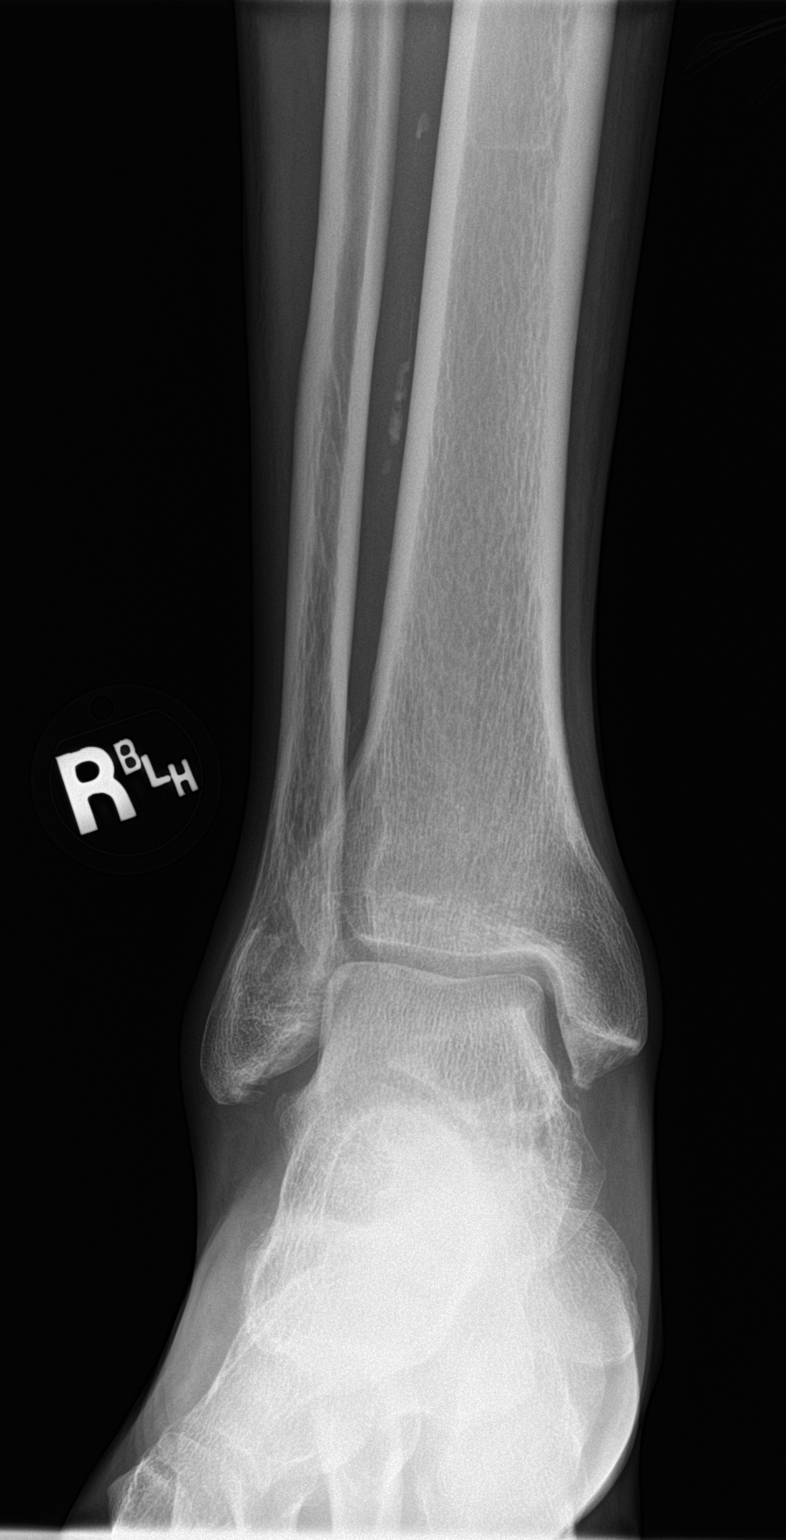

[ankle obl]
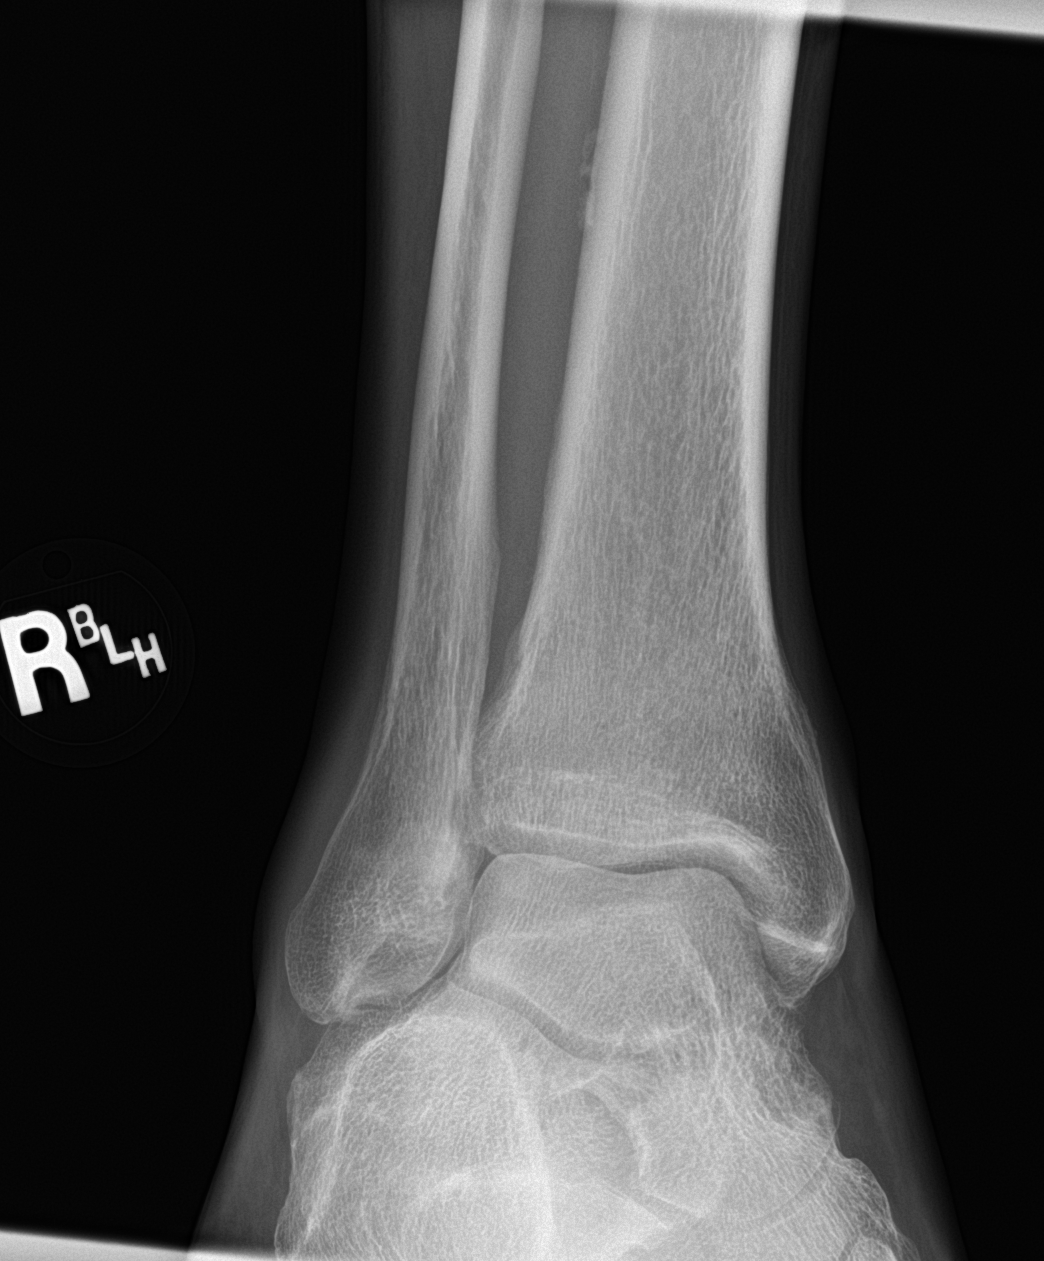

[ankle lat]
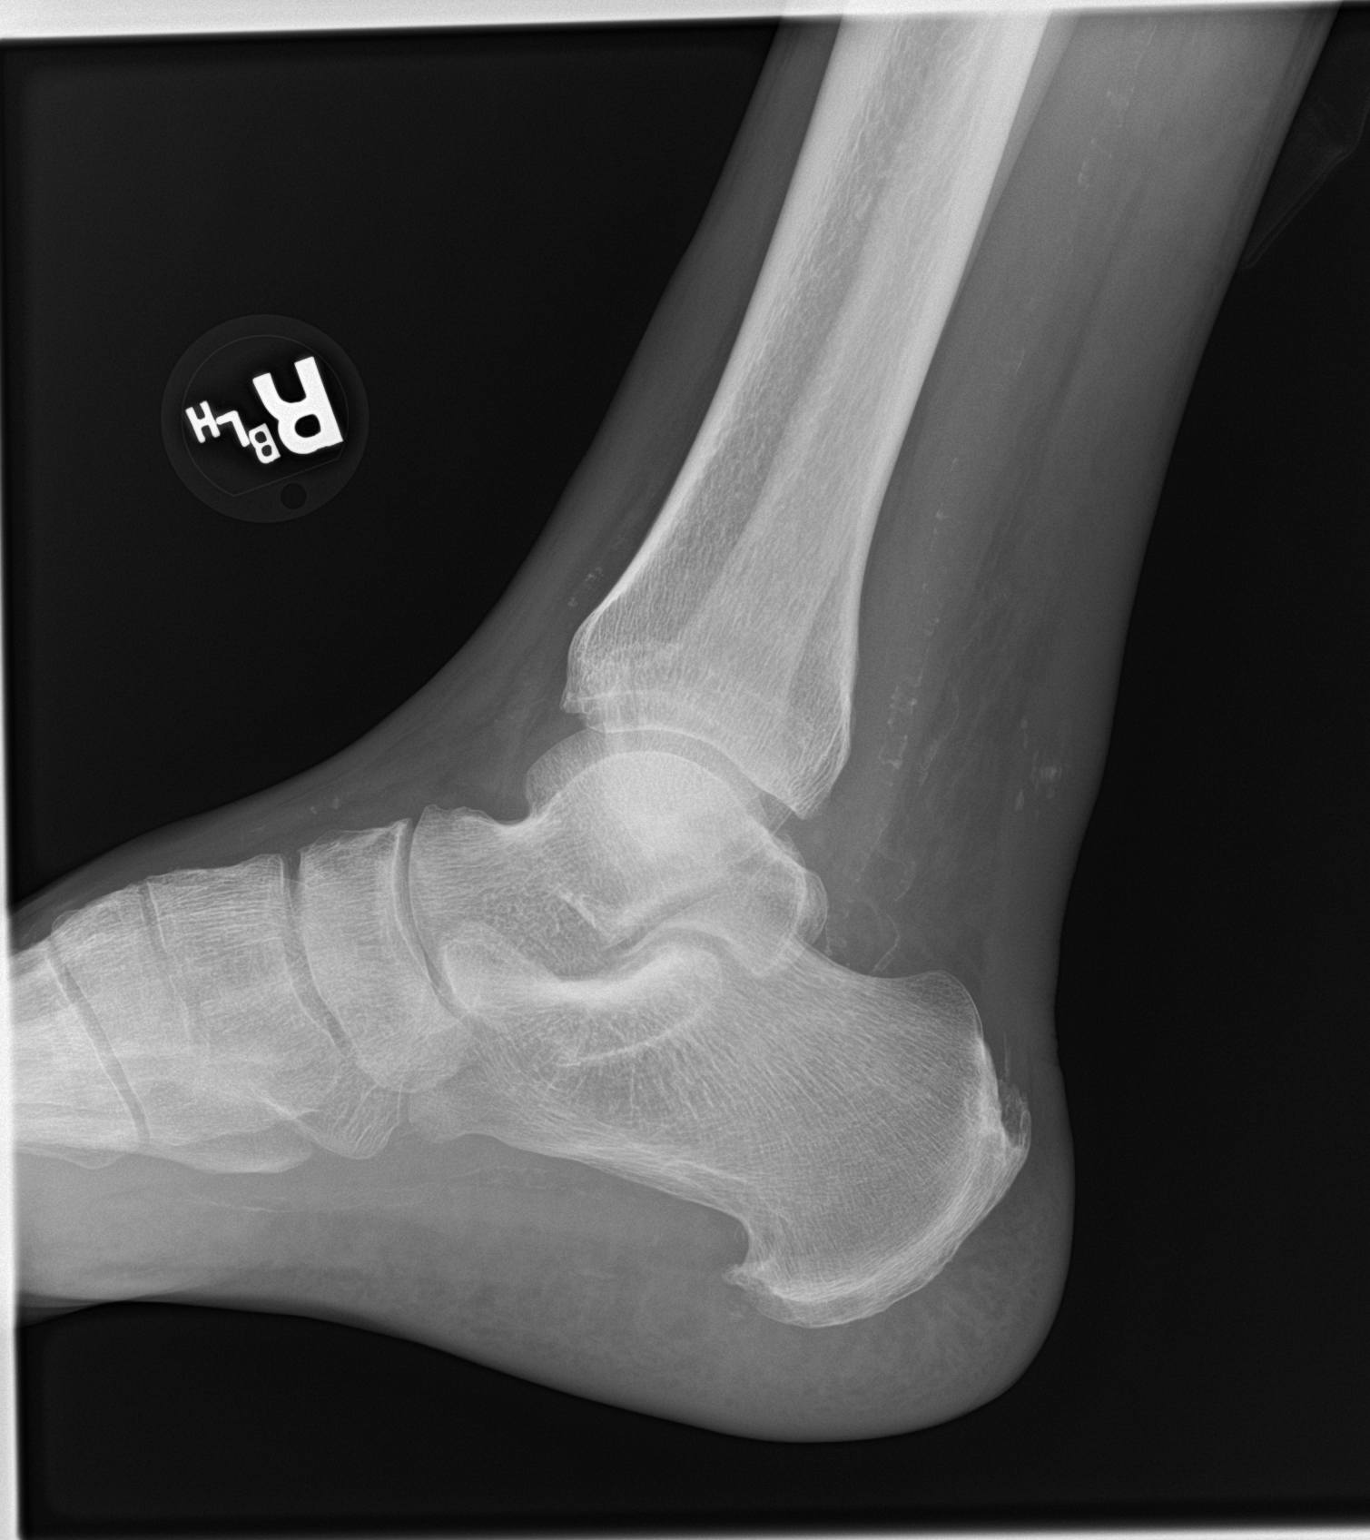

[3 of 3 positions shown; findings below may reference images not displayed]

FINDINGS: There is no evidence of fracture, dislocation, or joint effusion.
There is no evidence of arthropathy or other focal bone abnormality.
There is mild fusiform thickening of the Achilles tendon which
contains calcifications.
IMPRESSION: 1. No acute bone abnormality.
2. Fullness of the Achilles tendon with calcifications which may
represent calcific tendinopathy or tendinitis.

## 2021-06-29 IMAGING — DX DG CHEST 1V PORT
1 series · 1 of 1 positions shown · non-contrast
Comparison: CT abdomen pelvis 11/26/2017, radiograph 11/16/2016

CLINICAL DATA: Cough, fever

EXAM:
PORTABLE CHEST 1 VIEW

[chest ap]
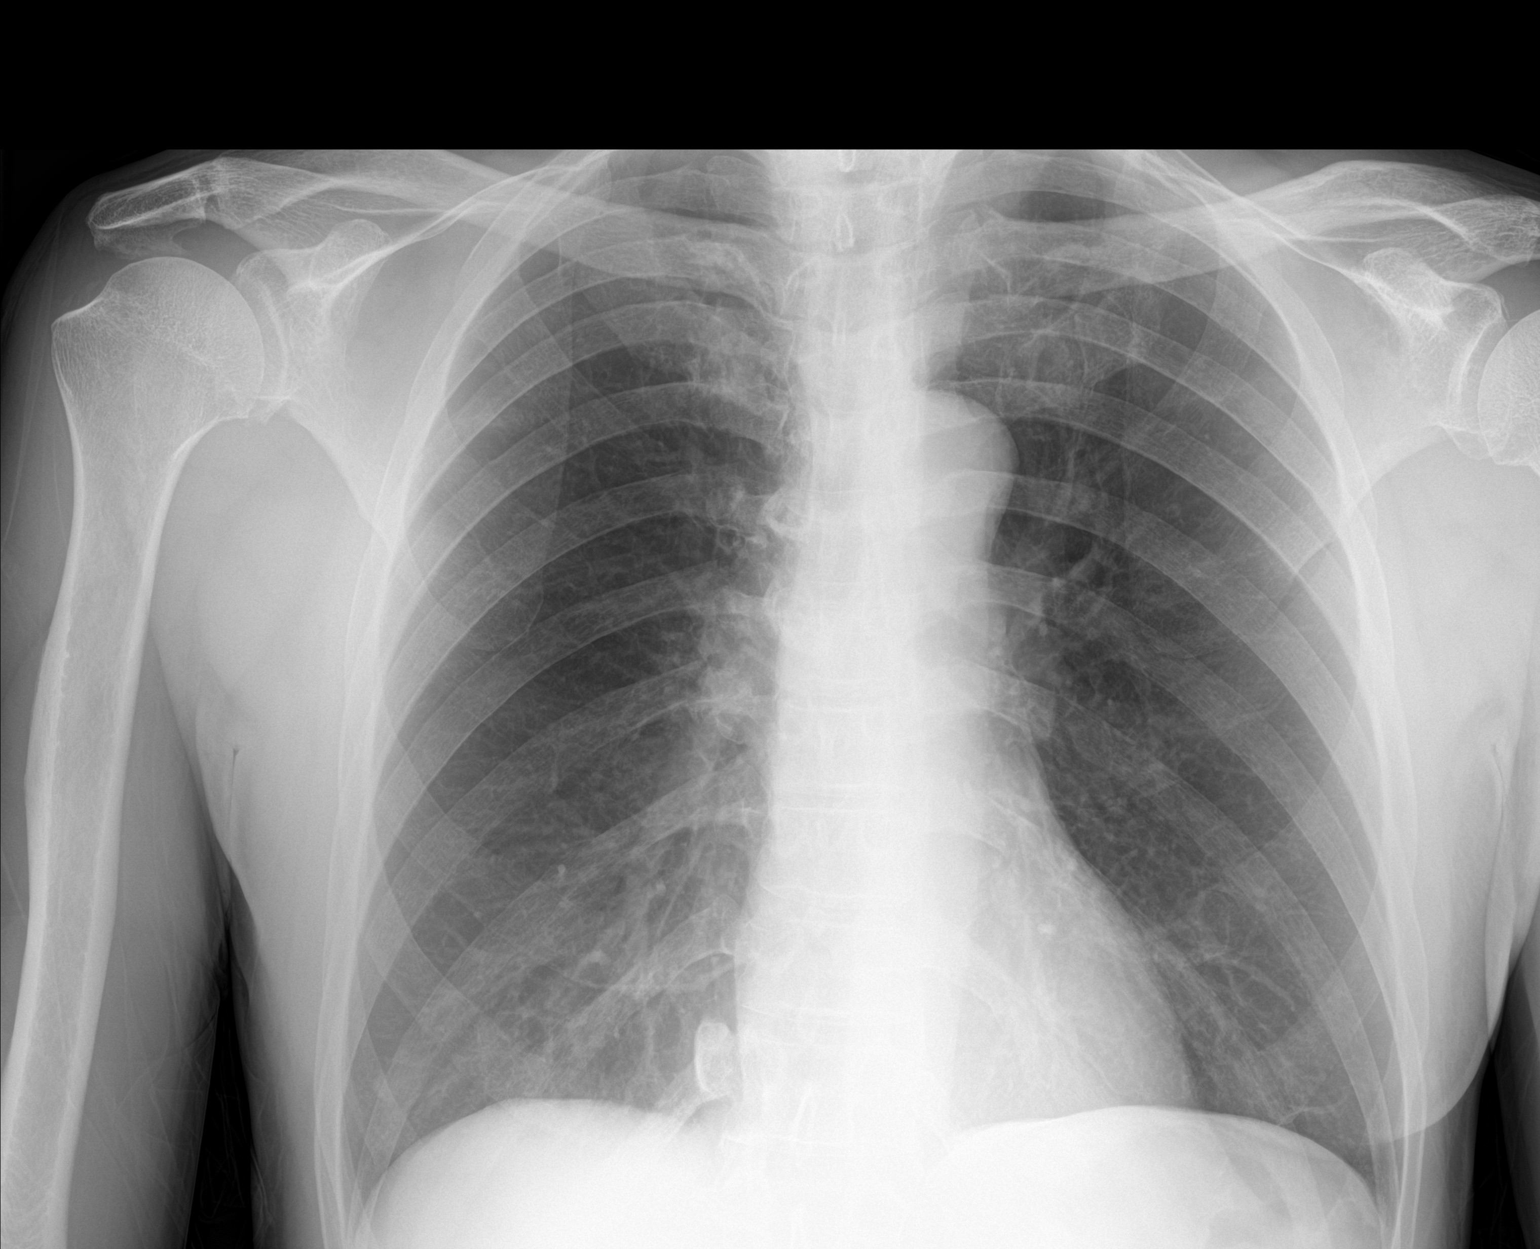

[1 of 1 positions shown; findings below may reference images not displayed]

FINDINGS: Chronic hyperinflation is similar to priors. No consolidation,
features of edema, pneumothorax, or effusion. The cardiomediastinal
contours are unremarkable. Radiodensity projecting over the medial
right lung base compatible with hypertrophic change at the right 12
costovertebral junction. No acute osseous or soft tissue
abnormality.
IMPRESSION: 1. Chronic hyperinflation. No acute cardiopulmonary disease.
2. Stable hypertrophic change at the right 12th costovertebral
junction.

## 2021-10-07 ENCOUNTER — Emergency Department (HOSPITAL_COMMUNITY)
Admission: EM | Admit: 2021-10-07 | Discharge: 2021-10-07 | Disposition: A | Discharge status: Home / Self Care | Payer: Medicare Other | Attending: Emergency Medicine | Admitting: Emergency Medicine

## 2021-10-07 ENCOUNTER — Emergency Department (HOSPITAL_COMMUNITY): Payer: Medicare Other

## 2021-10-07 ENCOUNTER — Encounter (HOSPITAL_COMMUNITY): Payer: Self-pay | Admitting: Emergency Medicine

## 2021-10-07 ENCOUNTER — Telehealth (HOSPITAL_COMMUNITY): Payer: Self-pay | Admitting: Emergency Medicine

## 2021-10-07 DIAGNOSIS — M25551 Pain in right hip: Secondary | ICD-10-CM | POA: Diagnosis present

## 2021-10-07 MED ORDER — TRAMADOL HCL 50 MG PO TABS: 50.0000 mg | ORAL_TABLET | Freq: Four times a day (QID) | ORAL | 0 refills | Status: DC | PRN

## 2021-10-07 NOTE — ED Notes (Signed)
Dc instructions and scripts reviewed with pt will follow up with pcp. No questions at this time.

## 2021-10-07 NOTE — Discharge Instructions (Addendum)
Call your primary care doctor or specialist as discussed in the next 2-3 days.    Take the medications written.  You can also try lidocaine topical patches as well.  Return immediately back to the ER if:  Your symptoms worsen within the next 12-24 hours. You develop new symptoms such as new fevers, persistent vomiting, new pain, shortness of breath, or new weakness or numbness, or if you have any other concerns.
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
## 2021-10-07 NOTE — ED Triage Notes (Signed)
Per pt, states right hip pain radiating down right leg-no injury or trauma-advil not working

## 2021-10-12 ENCOUNTER — Emergency Department (HOSPITAL_COMMUNITY)
Admission: EM | Admit: 2021-10-12 | Discharge: 2021-10-12 | Disposition: A | Discharge status: Home / Self Care | Payer: Medicare Other | Attending: Emergency Medicine | Admitting: Emergency Medicine

## 2021-10-12 ENCOUNTER — Encounter (HOSPITAL_COMMUNITY): Payer: Self-pay

## 2021-10-12 ENCOUNTER — Other Ambulatory Visit: Payer: Self-pay

## 2021-10-12 DIAGNOSIS — E119 Type 2 diabetes mellitus without complications: Secondary | ICD-10-CM | POA: Diagnosis not present

## 2021-10-12 DIAGNOSIS — M79604 Pain in right leg: Secondary | ICD-10-CM | POA: Diagnosis not present

## 2021-10-12 DIAGNOSIS — Z79899 Other long term (current) drug therapy: Secondary | ICD-10-CM | POA: Insufficient documentation

## 2021-10-12 DIAGNOSIS — I10 Essential (primary) hypertension: Secondary | ICD-10-CM | POA: Insufficient documentation

## 2021-10-12 DIAGNOSIS — M25551 Pain in right hip: Secondary | ICD-10-CM | POA: Diagnosis not present

## 2021-10-12 DIAGNOSIS — Z7984 Long term (current) use of oral hypoglycemic drugs: Secondary | ICD-10-CM | POA: Diagnosis not present

## 2021-10-12 MED ORDER — METHOCARBAMOL 500 MG PO TABS: 500.0000 mg | ORAL_TABLET | Freq: Two times a day (BID) | ORAL | 0 refills | Status: DC

## 2021-10-12 MED ORDER — PREDNISONE 20 MG PO TABS: 40.0000 mg | ORAL_TABLET | Freq: Every day | ORAL | 0 refills | Status: AC

## 2021-10-12 MED ORDER — DEXAMETHASONE SODIUM PHOSPHATE 10 MG/ML IJ SOLN
10.0000 mg | Freq: Once | INTRAMUSCULAR | Status: AC
  Administered 2021-10-12: 10 mg via INTRAMUSCULAR
  Filled 2021-10-12: qty 1
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
## 2021-10-12 NOTE — Discharge Instructions (Addendum)
You were seen in the emergency department a day for hip pain.  While you are here we changed her medication to the using a muscle relaxant as well as steroids to reduce the swelling and irritation.  Please return if you are unable to ambulate/walk on the leg.  Please return to primary care provider for further symptom control.

## 2021-11-24 ENCOUNTER — Other Ambulatory Visit: Payer: Self-pay | Admitting: Family Medicine

## 2021-11-24 DIAGNOSIS — Z720 Tobacco use: Secondary | ICD-10-CM

## 2021-12-28 ENCOUNTER — Ambulatory Visit
Admission: RE | Admit: 2021-12-28 | Discharge: 2021-12-28 | Disposition: A | Discharge status: Home / Self Care | Payer: Medicare Other | Source: Ambulatory Visit | Attending: Family Medicine | Admitting: Family Medicine

## 2021-12-28 DIAGNOSIS — Z720 Tobacco use: Secondary | ICD-10-CM

## 2022-01-04 ENCOUNTER — Other Ambulatory Visit: Payer: Self-pay | Admitting: Family Medicine

## 2022-01-04 DIAGNOSIS — Z136 Encounter for screening for cardiovascular disorders: Secondary | ICD-10-CM

## 2022-01-25 ENCOUNTER — Ambulatory Visit
Admission: RE | Admit: 2022-01-25 | Discharge: 2022-01-25 | Disposition: A | Discharge status: Home / Self Care | Payer: Medicaid Other | Source: Ambulatory Visit | Attending: Family Medicine | Admitting: Family Medicine

## 2022-01-25 ENCOUNTER — Other Ambulatory Visit: Payer: Self-pay | Admitting: Family Medicine

## 2022-01-25 DIAGNOSIS — Z136 Encounter for screening for cardiovascular disorders: Secondary | ICD-10-CM

## 2022-01-29 ENCOUNTER — Other Ambulatory Visit (HOSPITAL_COMMUNITY): Payer: Self-pay | Admitting: Family Medicine

## 2022-01-29 DIAGNOSIS — I739 Peripheral vascular disease, unspecified: Secondary | ICD-10-CM

## 2022-02-01 ENCOUNTER — Ambulatory Visit (HOSPITAL_COMMUNITY)
Admission: RE | Admit: 2022-02-01 | Discharge: 2022-02-01 | Disposition: A | Discharge status: Home / Self Care | Payer: Medicare Other | Source: Ambulatory Visit | Attending: Family Medicine | Admitting: Family Medicine

## 2022-02-01 DIAGNOSIS — I739 Peripheral vascular disease, unspecified: Secondary | ICD-10-CM | POA: Insufficient documentation

## 2022-03-17 ENCOUNTER — Telehealth: Payer: Self-pay

## 2022-03-17 NOTE — Telephone Encounter (Signed)
NOTES SCANNED TO REFERRAL 

## 2022-04-26 ENCOUNTER — Encounter: Payer: Self-pay | Admitting: Cardiovascular Disease

## 2022-04-26 ENCOUNTER — Ambulatory Visit (INDEPENDENT_AMBULATORY_CARE_PROVIDER_SITE_OTHER): Payer: Medicare Other | Admitting: Cardiovascular Disease

## 2022-04-26 VITALS — BP 140/62 | HR 44 | Ht 71.0 in | Wt 161.4 lb

## 2022-04-26 DIAGNOSIS — I739 Peripheral vascular disease, unspecified: Secondary | ICD-10-CM | POA: Diagnosis not present

## 2022-04-26 NOTE — Assessment & Plan Note (Signed)
History of essential hypertension a blood pressure measured at 140/62.  He is on amlodipine, lisinopril and hydrochlorothiazide.

## 2022-04-26 NOTE — Assessment & Plan Note (Signed)
History of dyslipidemia on statin therapy followed by his PCP 

## 2022-04-26 NOTE — Assessment & Plan Note (Addendum)
Colton Garcia was referred to me by Dr. Lavonia Drafts for evaluation of PAD.  He does have a history of tobacco abuse, treated hypertension, diabetes and hyperlipidemia.  He complains of constant numbness in his pretibial area and feet as well as some pain shooting down his right leg worse when he lies down.  This sounds neuropathic.  His pain otherwise sounds like diabetic peripheral neuropathy.  He denies symptoms of claudication.  He had Doppler studies performed in our office 02/01/2022 that showed normal ABIs with monophasic waveforms.  I am going to get lower extremity arterial Doppler studies although I do not feel he has significant obstructive disease by history.  He does have diminished pedal pulses.

## 2022-04-26 NOTE — Patient Instructions (Signed)
Medication Instructions:  No changes *If you need a refill on your cardiac medications before your next appointment, please call your pharmacy*   Lab Work: None ordered If you have labs (blood work) drawn today and your tests are completely normal, you will receive your results only by: [facility] (if you have MyChart) OR A paper copy in the mail If you have any lab test that is abnormal or we need to change your treatment, we will call you to review the results.   Testing/Procedures: Your physician has requested that you have a lower extremity arterial duplex. During this test, ultrasound is used to evaluate arterial blood flow in the legs. Allow one hour for this exam. There are no restrictions or special instructions. This will take place at [address], [address].  Follow-Up: At [facility], you and your health needs are our priority.  As part of our continuing mission to provide you with exceptional heart care, we have created designated Provider Care Teams.  These Care Teams include your primary Cardiologist (physician) and Advanced Practice Providers (APPs -  Physician Assistants and Nurse Practitioners) who all work together to provide you with the care you need, when you need it.  We recommend signing up for the patient portal called "MyChart".  Sign up information is provided on this After Visit Summary.  MyChart is used to connect with patients for Virtual Visits (Telemedicine).  Patients are able to view lab/test results, encounter notes, upcoming appointments, etc.  Non-urgent messages can be sent to your provider as well.   To learn more about what you can do with MyChart, go to [email or web address].    Your next appointment:   Follow up as needed with Dr. Gwenlyn Found  Important Information About Sugar

## 2022-04-26 NOTE — Progress Notes (Signed)
04/26/2022 Colton Garcia   09-Jun-1951  [id number]  Primary Physician Vassie Moment, MD Primary Cardiologist: Lorretta Harp MD Lupe Carney, [place]  HPI:  Colton Garcia is a 71 y.o. thin-appearing divorced African-American male father of 2 children, grandfather of 5 grandchildren referred by Dr. Lavonia Drafts, his PCP.  For evaluation of PAD.  He continues to work running a Teacher, music.  Stress factors include ongoing tobacco abuse of 1 pack/week, treated diabetes hypertension hyperlipidemia.  Is never had a heart attack or stroke.  There is no family history of heart disease.  He denies chest pain or shortness of breath.  Does walk a mile a day.  He noticed discomfort in his legs beginning approximately 8 months ago.  He is chronic numbness in his feet which I suspect is related to diabetic peripheral neuropathy as well as in his pretibial area.  Also has pain shooting down his right leg which sounds neuropathic.  He specifically denies symptoms of claudication.  He did have Doppler studies performed 02/01/2022 that showed ABIs in the normal range with monophasic waveforms although they were brisk.   Current Meds  Medication Sig   amLODipine (NORVASC) 10 MG tablet Take 10 mg by mouth at bedtime.    cetirizine (ZYRTEC) 10 MG tablet Take 10 mg by mouth at bedtime.   gabapentin (NEURONTIN) 300 MG capsule Take 300 mg by mouth 2 (two) times daily.   glipiZIDE (GLUCOTROL) 5 MG tablet Take 5 mg by mouth 2 (two) times daily.   lisinopril-hydrochlorothiazide (ZESTORETIC) 20-25 MG tablet Take 1 tablet by mouth daily.   meloxicam (MOBIC) 7.5 MG tablet Take 7.5 mg by mouth daily as needed.   Multiple Vitamins-Minerals (IMMUNE SUPPORT VITAMIN C PO) Take 750 mg by mouth daily.   oxyCODONE-acetaminophen (PERCOCET/ROXICET) 5-325 MG tablet Take 1 tablet by mouth 4 (four) times daily as needed.   pravastatin (PRAVACHOL) 20 MG tablet Take 20 mg by mouth at bedtime.     No Known Allergies  Social  History   Socioeconomic History   Marital status: Divorced    Spouse name: Not on file   Number of children: Not on file   Years of education: Not on file   Highest education level: Not on file  Occupational History   Not on file  Tobacco Use   Smoking status: Every Day    Packs/day: 0.20    Types: Cigarettes   Smokeless tobacco: Never  Vaping Use   Vaping Use: Never used  Substance and Sexual Activity   Alcohol use: No   Drug use: No    Types: Marijuana    Comment: last used 2015   Sexual activity: Not on file  Other Topics Concern   Not on file  Social History Narrative   Not on file   Social Determinants of Health   Financial Resource Strain: Not on file  Food Insecurity: Not on file  Transportation Needs: Not on file  Physical Activity: Not on file  Stress: Not on file  Social Connections: Not on file  Intimate Partner Violence: Not on file     Review of Systems: General: negative for chills, fever, night sweats or weight changes.  Cardiovascular: negative for chest pain, dyspnea on exertion, edema, orthopnea, palpitations, paroxysmal nocturnal dyspnea or shortness of breath Dermatological: negative for rash Respiratory: negative for cough or wheezing Urologic: negative for hematuria Abdominal: negative for nausea, vomiting, diarrhea, bright red blood per rectum, melena, or hematemesis Neurologic: negative for  visual changes, syncope, or dizziness All other systems reviewed and are otherwise negative except as noted above.    Blood pressure (!) 140/62, pulse (!) 44, height '5\' 11"'$  (1.803 m), weight 161 lb 6.4 oz (73.2 kg), SpO2 99 %.  General appearance: alert and no distress Neck: no adenopathy, no carotid bruit, no JVD, supple, symmetrical, trachea midline, and thyroid not enlarged, symmetric, no tenderness/mass/nodules Lungs: clear to auscultation bilaterally Heart: regular rate and rhythm, S1, S2 normal, no murmur, click, rub or gallop Extremities:  extremities normal, atraumatic, no cyanosis or edema Pulses: Diminished pedal pulses Skin: Skin color, texture, turgor normal. No rashes or lesions Neurologic: Grossly normal  EKG sinus bradycardia 44 with early repolarization pattern and left axis deviation.  There were changes of LVH.  I personally reviewed this EKG.  ASSESSMENT AND PLAN:   DYSLIPIDEMIA History of dyslipidemia on statin therapy followed by his PCP  HYPERTENSION History of essential hypertension a blood pressure measured at 140/62.  He is on amlodipine, lisinopril and hydrochlorothiazide.  Peripheral arterial disease [facility]) Mr. Turney was referred to me by Dr. Lavonia Drafts for evaluation of PAD.  He does have a history of tobacco abuse, treated hypertension, diabetes and hyperlipidemia.  He complains of constant numbness in his pretibial area and feet as well as some pain shooting down his right leg worse when he lies down.  This sounds neuropathic.  His pain otherwise sounds like diabetic peripheral neuropathy.  He denies symptoms of claudication.  He had Doppler studies performed in our office 02/01/2022 that showed normal ABIs with monophasic waveforms.  I am going to get lower extremity arterial Doppler studies although I do not feel he has significant obstructive disease by history.  He does have diminished pedal pulses.     Lorretta Harp MD FACP,FACC,FAHA, [facility] 04/26/2022 8:26 AM

## 2022-04-29 ENCOUNTER — Other Ambulatory Visit: Payer: Self-pay | Admitting: Cardiovascular Disease

## 2022-04-29 DIAGNOSIS — I739 Peripheral vascular disease, unspecified: Secondary | ICD-10-CM

## 2022-05-10 ENCOUNTER — Ambulatory Visit (HOSPITAL_COMMUNITY)
Admission: RE | Admit: 2022-05-10 | Discharge: 2022-05-10 | Disposition: A | Discharge status: Home / Self Care | Payer: Medicare Other | Source: Ambulatory Visit | Attending: Cardiology | Admitting: Cardiology

## 2022-05-10 DIAGNOSIS — I739 Peripheral vascular disease, unspecified: Secondary | ICD-10-CM | POA: Diagnosis present

## 2022-05-24 ENCOUNTER — Other Ambulatory Visit: Payer: Self-pay | Admitting: Family Medicine

## 2022-05-24 DIAGNOSIS — Z122 Encounter for screening for malignant neoplasm of respiratory organs: Secondary | ICD-10-CM

## 2022-05-24 DIAGNOSIS — Z720 Tobacco use: Secondary | ICD-10-CM

## 2022-05-31 ENCOUNTER — Ambulatory Visit (INDEPENDENT_AMBULATORY_CARE_PROVIDER_SITE_OTHER): Payer: Medicare Other | Admitting: Surgery

## 2022-05-31 ENCOUNTER — Encounter: Payer: Self-pay | Admitting: Surgery

## 2022-05-31 VITALS — BP 146/71 | HR 51 | Temp 98.8°F | Resp 20 | Ht 71.0 in | Wt 162.0 lb

## 2022-05-31 DIAGNOSIS — I739 Peripheral vascular disease, unspecified: Secondary | ICD-10-CM
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
## 2022-06-03 ENCOUNTER — Other Ambulatory Visit: Payer: Self-pay

## 2022-06-03 DIAGNOSIS — I739 Peripheral vascular disease, unspecified: Secondary | ICD-10-CM

## 2022-06-21 ENCOUNTER — Inpatient Hospital Stay: Admission: RE | Admit: 2022-06-21 | Payer: Medicare Other | Source: Ambulatory Visit

## 2022-10-21 ENCOUNTER — Encounter: Payer: Self-pay | Admitting: Neurology
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
## 2022-11-29 ENCOUNTER — Ambulatory Visit (INDEPENDENT_AMBULATORY_CARE_PROVIDER_SITE_OTHER): Payer: Medicare Other | Admitting: Neurology

## 2022-11-29 ENCOUNTER — Encounter: Payer: Self-pay | Admitting: Neurology

## 2022-11-29 VITALS — BP 129/59 | HR 51 | Ht 71.0 in | Wt 162.0 lb

## 2022-11-29 DIAGNOSIS — R202 Paresthesia of skin: Secondary | ICD-10-CM

## 2022-11-29 DIAGNOSIS — R200 Anesthesia of skin: Secondary | ICD-10-CM | POA: Diagnosis not present

## 2022-11-29 NOTE — Patient Instructions (Signed)
CT lumbar spine without contrast  Nerve testing of the right leg  ELECTROMYOGRAM AND NERVE CONDUCTION STUDIES (EMG/NCS) INSTRUCTIONS  How to Prepare The neurologist conducting the EMG will need to know if you have certain medical conditions. Tell the neurologist and other EMG lab personnel if you: Have a pacemaker or any other electrical medical device Take blood-thinning medications Have hemophilia, a blood-clotting disorder that causes prolonged bleeding Bathing Take a shower or bath shortly before your exam in order to remove oils from your skin. Don't apply lotions or creams before the exam.  What to Expect You'll likely be asked to change into a hospital gown for the procedure and lie down on an examination table. The following explanations can help you understand what will happen during the exam.  Electrodes. The neurologist or a technician places surface electrodes at various locations on your skin depending on where you're experiencing symptoms. Or the neurologist may insert needle electrodes at different sites depending on your symptoms.  Sensations. The electrodes will at times transmit a tiny electrical current that you may feel as a twinge or spasm. The needle electrode may cause discomfort or pain that usually ends shortly after the needle is removed. If you are concerned about discomfort or pain, you may want to talk to the neurologist about taking a short break during the exam.  Instructions. During the needle EMG, the neurologist will assess whether there is any spontaneous electrical activity when the muscle is at rest - activity that isn't present in healthy muscle tissue - and the degree of activity when you slightly contract the muscle.  He or she will give you instructions on resting and contracting a muscle at appropriate times. Depending on what muscles and nerves the neurologist is examining, he or she may ask you to change positions during the exam.  After your EMG You  may experience some temporary, minor bruising where the needle electrode was inserted into your muscle. This bruising should fade within several days. If it persists, contact your primary care doctor.

## 2022-12-26 ENCOUNTER — Encounter (HOSPITAL_COMMUNITY): Payer: Self-pay

## 2022-12-26 ENCOUNTER — Emergency Department (HOSPITAL_COMMUNITY)
Admission: EM | Admit: 2022-12-26 | Discharge: 2022-12-26 | Disposition: A | Discharge status: Home / Self Care | Payer: Medicare Other | Attending: Emergency Medicine | Admitting: Emergency Medicine

## 2022-12-26 DIAGNOSIS — M436 Torticollis: Secondary | ICD-10-CM | POA: Insufficient documentation

## 2022-12-26 DIAGNOSIS — E119 Type 2 diabetes mellitus without complications: Secondary | ICD-10-CM | POA: Insufficient documentation

## 2022-12-26 DIAGNOSIS — Z79899 Other long term (current) drug therapy: Secondary | ICD-10-CM | POA: Insufficient documentation

## 2022-12-26 DIAGNOSIS — Z7984 Long term (current) use of oral hypoglycemic drugs: Secondary | ICD-10-CM | POA: Insufficient documentation

## 2022-12-26 DIAGNOSIS — M542 Cervicalgia: Secondary | ICD-10-CM | POA: Diagnosis present

## 2022-12-26 MED ORDER — METHOCARBAMOL 500 MG PO TABS
250.0000 mg | ORAL_TABLET | Freq: Once | ORAL | Status: AC
  Administered 2022-12-26: 250 mg via ORAL
  Filled 2022-12-26: qty 1

## 2022-12-26 MED ORDER — METHOCARBAMOL 500 MG PO TABS: 250.0000 mg | ORAL_TABLET | Freq: Two times a day (BID) | ORAL | 0 refills | Status: AC

## 2022-12-26 MED ORDER — ACETAMINOPHEN 500 MG PO TABS: 500.0000 mg | ORAL_TABLET | Freq: Four times a day (QID) | ORAL | 0 refills | Status: DC | PRN

## 2022-12-26 MED ORDER — LIDOCAINE 5 % EX PTCH
1.0000 | MEDICATED_PATCH | CUTANEOUS | Status: DC
  Administered 2022-12-26: 1 via TRANSDERMAL
  Filled 2022-12-26: qty 1

## 2022-12-26 MED ORDER — LIDOCAINE 5 % EX PTCH: 1.0000 | MEDICATED_PATCH | CUTANEOUS | 0 refills | Status: DC

## 2022-12-26 MED ORDER — NAPROXEN 500 MG PO TABS: 500.0000 mg | ORAL_TABLET | Freq: Two times a day (BID) | ORAL | 0 refills | Status: AC

## 2022-12-26 MED ORDER — KETOROLAC TROMETHAMINE 15 MG/ML IJ SOLN
15.0000 mg | Freq: Once | INTRAMUSCULAR | Status: AC
  Administered 2022-12-26: 15 mg via INTRAMUSCULAR
  Filled 2022-12-26: qty 1

## 2022-12-26 NOTE — ED Provider Notes (Signed)
[facility] EMERGENCY DEPARTMENT AT [facility] Provider Note   CSN: [id number] Arrival date & time: 12/26/22  1153     History  Chief Complaint  Patient presents with   Neck Pain    Colton Garcia is a 72 y.o. male with a past medical history of hyperlipidemia, type 2 diabetes and hep C presenting today with neck stiffness.  Reports that it has been going on for about 2 days.  Difficulty rotating his head.  No known injury.  No previous surgery.  No fevers, chills, photophobia, IVDU, dizziness or headaches.  Says "feels like I am having a muscle spasm."  Does report more of a sedentary lifestyle over the past few days due to recently having the flu.  Occasionally has relief with heat pad.   Neck Pain      Home Medications Prior to Admission medications   Medication Sig Start Date End Date Taking? Authorizing Provider  amLODipine (NORVASC) 10 MG tablet Take 10 mg by mouth at bedtime.     [provider]  cetirizine (ZYRTEC) 10 MG tablet Take 10 mg by mouth at bedtime. 02/08/20   [provider]  Elderberry-Vitamin C-Zinc (ELDERBERRY IMMUNE HEALTH GUMMY PO) Take by mouth.    [provider]  fluticasone (FLONASE) 50 MCG/ACT nasal spray Place into the nose. 10/18/22   [provider]  gabapentin (NEURONTIN) 300 MG capsule Take 300 mg by mouth 2 (two) times daily. 04/09/22   [provider]  glipiZIDE (GLUCOTROL) 5 MG tablet Take 5 mg by mouth 2 (two) times daily. 02/08/20   [provider]  lisinopril-hydrochlorothiazide (ZESTORETIC) 20-25 MG tablet Take 1 tablet by mouth daily. 02/26/22   [provider]  Multiple Vitamins-Minerals (IMMUNE SUPPORT VITAMIN C PO) Take 750 mg by mouth daily.    [provider]  pravastatin (PRAVACHOL) 20 MG tablet Take 20 mg by mouth at bedtime. 02/08/20   [provider]  Pseudoeph-Doxylamine-DM-APAP (NYQUIL PO) Take by mouth.    [provider]  sildenafil  (VIAGRA) 100 MG tablet TAKE 1/2 TO 1 (ONE-HALF TO ONE) TABLET BY MOUTH AS NEEDED    [provider]      Allergies    Patient has no known allergies.    Review of Systems   Review of Systems  Musculoskeletal:  Positive for neck pain.    Physical Exam Updated Vital Signs BP 134/66   Pulse 67   Temp 98.3 F (36.8 C) (Oral)   Resp 18   Ht 5\' 11"  (1.803 m)   Wt 72.6 kg   SpO2 98%   BMI 22.32 kg/m  Physical Exam Vitals and nursing note reviewed.  Constitutional:      Appearance: Normal appearance.  HENT:     Head: Normocephalic and atraumatic.  Eyes:     General: No scleral icterus.    Conjunctiva/sclera: Conjunctivae normal.  Neck:     Comments: Patient unable to rotate the cervical spine. Pulmonary:     Effort: Pulmonary effort is normal. No respiratory distress.  Skin:    Findings: No rash.  Neurological:     Mental Status: He is alert.     Comments: Cranial nerves II through XII grossly intact.  No visual field deficit.  Normal strength in bilateral upper extremities.  No facial droop or aphasia  Psychiatric:        Mood and Affect: Mood normal.     ED Results / Procedures / Treatments   Labs (all labs  ordered are listed, but only abnormal results are displayed) Labs Reviewed - No data to display  EKG None  Radiology No results found.  Procedures Procedures   Medications Ordered in ED Medications  lidocaine (LIDODERM) 5 % 1 patch (has no administration in time range)  ketorolac (TORADOL) 15 MG/ML injection 15 mg (has no administration in time range)  methocarbamol (ROBAXIN) tablet 250 mg (has no administration in time range)    ED Course/ Medical Decision Making/ A&P Clinical Course as of 12/26/22 1556  Sun Dec 26, 2022  1552 Reevaluated with improved range of motion of the neck after muscle relaxant, NSAID any signs of, and Lidoderm, [MR]    Clinical Course User Index [MR] Colton Garcia, Colton Cirri, PA-C                              Medical Decision Making Risk Prescription drug management.   72 year old presenting today due to neck pain.  Differential includes but is not limited to muscle spasm, fracture, radiculopathy, meningitis, arterial dissection.  This is not an exhaustive differential.    Physical Exam: Pertinent physical exam findings include Decreased range of motion of the neck with lateral rotation     Medications: Low-dose muscle relaxant, Toradol injection and lidocaine patches with much improvement on reevaluation    MDM/Disposition: This is a 72 year old male who presented today with difficulty moving his neck.  No known injuries.  No previous procedures on the neck.  He had no fevers or chills.  Not tachycardic.  Low suspicion for meningitis, also no photophobia.  Patient denies any inciting injury, radiation, headaches or other concerning findings.  Neuroexam normal.  Low suspicion CVA/vertebral artery dissection.  No trauma indicating CT imaging.  Patient was treated with NSAID, Robaxin and lidocaine patch with improvement.  He reports feeling much better.  We discussed that muscle relaxant sometimes can make people dizzy, lightheaded and place you at higher risk for falls.  He is 72 however he is highly functioning and appears much younger.  He says he had no symptoms with that muscle relaxant today so I am willing to prescribe a small dose for the next week or until he is able to see his PCP sooner.  I also sent lidocaine patches to his pharmacy.  No history of renal disease and is not on any blood thinners so we will also prescribe naproxen.  Strict return precautions were given and attached to his discharge papers.  He was discharged home    Final Clinical Impression(s) / ED Diagnoses Final diagnoses:  Torticollis, acute    Rx / DC Orders ED Discharge Orders     None      Results and diagnoses were explained to the patient. Return precautions discussed in full. Patient had no additional  questions and expressed complete understanding.   This chart was dictated using voice recognition software.  Despite best efforts to proofread,  errors can occur which can change the documentation meaning.    Colton Garcia 12/26/22 1602    Colton Manchester, MD 12/29/22 [phone]

## 2022-12-26 NOTE — ED Triage Notes (Signed)
Pt c/o spasms that led him to not being able to turn his head since Friday. Pt just returned to work after being out with the flu. Pt reports some relief with heating pad.

## 2022-12-26 NOTE — Discharge Instructions (Addendum)
You came to the emergency department today with difficulty moving your neck.  This improved with anti-inflammatories, muscle relaxants and lidocaine patches.  I have prescribed the following:  Naproxen.  This is an NSAID, also known as an anti-inflammatory.  Do not take ibuprofen at the same time. Tylenol for pain and inflammation Lidocaine patches.  These may be worn for 12 hours at a time.  Be sure to have 12 hours patch free.  You should take the patch that you are wearing off by 1 this morning. Robaxin.  This is a muscle relaxant.  Do not drive or operate machinery on this.  Definitely do not drink alcohol on this.  It makes some people drowsy so if it makes you dizzy please stop taking this medication.  Continue with heat packs and please make an appointment with your PCP.  Do not hesitate to return with any worsening symptoms, especially severe headache, blurred vision, numbness or tingling of your body or any other concerns.

## 2023-01-03 ENCOUNTER — Ambulatory Visit
Admission: RE | Admit: 2023-01-03 | Discharge: 2023-01-03 | Disposition: A | Discharge status: Home / Self Care | Payer: Medicare Other | Source: Ambulatory Visit | Attending: Neurology | Admitting: Neurology

## 2023-01-03 DIAGNOSIS — R200 Anesthesia of skin: Secondary | ICD-10-CM

## 2023-01-13 ENCOUNTER — Encounter: Payer: Medicare Other | Admitting: Neurology

## 2023-01-24 ENCOUNTER — Telehealth: Payer: Self-pay | Admitting: Radiation Oncology

## 2023-01-24 NOTE — Telephone Encounter (Signed)
Spoke to pt to schedule consult with Dr. Kathrynn Running. Pt advised he would need to call back, he needs to check his work schedule before making an appt. Awaiting c/b today 5/13. I will reach out tomorrow if pt does not c/b today.

## 2023-01-25 NOTE — Progress Notes (Signed)
GU Location of Tumor / Histology: Prostate Ca  If Prostate Cancer, Gleason Score is (3 + 4) and PSA is (6.01 on 01/18/2023)  Biopsies       Past/Anticipated interventions by urology, if any: NA  Past/Anticipated interventions by medical oncology, if any: NA  Weight changes, if any: No  IPSS:  16 SHIM:  5  Bowel/Bladder complaints, if any:  Nothing since biopsy   Nausea/Vomiting, if any: No  Pain issues, if any:  0/10 right leg at times pain not too bad today.  SAFETY ISSUES: Prior radiation?  No Pacemaker/ICD? No Possible current pregnancy? Male Is the patient on methotrexate? No  Current Complaints / other details:  Have metal fragments in right hip prevents from getting MRI.

## 2023-01-27 ENCOUNTER — Encounter: Payer: Self-pay | Admitting: Urology

## 2023-01-27 DIAGNOSIS — C61 Malignant neoplasm of prostate: Secondary | ICD-10-CM | POA: Insufficient documentation

## 2023-01-28 ENCOUNTER — Ambulatory Visit
Admission: RE | Admit: 2023-01-28 | Discharge: 2023-01-28 | Disposition: A | Discharge status: Home / Self Care | Payer: Medicare Other | Source: Ambulatory Visit | Attending: Radiation Oncology | Admitting: Radiation Oncology

## 2023-01-28 ENCOUNTER — Ambulatory Visit (INDEPENDENT_AMBULATORY_CARE_PROVIDER_SITE_OTHER): Payer: Medicare Other | Admitting: Neurology

## 2023-01-28 ENCOUNTER — Encounter: Payer: Self-pay | Admitting: Radiation Oncology

## 2023-01-28 VITALS — BP 131/64 | HR 63 | Temp 98.0°F | Resp 18 | Ht 71.0 in | Wt 161.2 lb

## 2023-01-28 DIAGNOSIS — C61 Malignant neoplasm of prostate: Secondary | ICD-10-CM

## 2023-01-28 DIAGNOSIS — K861 Other chronic pancreatitis: Secondary | ICD-10-CM | POA: Insufficient documentation

## 2023-01-28 DIAGNOSIS — E119 Type 2 diabetes mellitus without complications: Secondary | ICD-10-CM | POA: Diagnosis not present

## 2023-01-28 DIAGNOSIS — I700 Atherosclerosis of aorta: Secondary | ICD-10-CM | POA: Diagnosis not present

## 2023-01-28 DIAGNOSIS — M47816 Spondylosis without myelopathy or radiculopathy, lumbar region: Secondary | ICD-10-CM | POA: Diagnosis not present

## 2023-01-28 DIAGNOSIS — M48061 Spinal stenosis, lumbar region without neurogenic claudication: Secondary | ICD-10-CM | POA: Diagnosis not present

## 2023-01-28 DIAGNOSIS — F17210 Nicotine dependence, cigarettes, uncomplicated: Secondary | ICD-10-CM | POA: Diagnosis not present

## 2023-01-28 DIAGNOSIS — M5136 Other intervertebral disc degeneration, lumbar region: Secondary | ICD-10-CM | POA: Diagnosis not present

## 2023-01-28 DIAGNOSIS — R202 Paresthesia of skin: Secondary | ICD-10-CM | POA: Diagnosis not present

## 2023-01-28 DIAGNOSIS — Z7984 Long term (current) use of oral hypoglycemic drugs: Secondary | ICD-10-CM | POA: Diagnosis not present

## 2023-01-28 DIAGNOSIS — I10 Essential (primary) hypertension: Secondary | ICD-10-CM | POA: Insufficient documentation

## 2023-01-28 DIAGNOSIS — Z79899 Other long term (current) drug therapy: Secondary | ICD-10-CM | POA: Insufficient documentation

## 2023-01-28 DIAGNOSIS — R200 Anesthesia of skin: Secondary | ICD-10-CM

## 2023-01-28 DIAGNOSIS — G5731 Lesion of lateral popliteal nerve, right lower limb: Secondary | ICD-10-CM
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
## 2023-01-28 NOTE — Procedures (Signed)
  [facility]  [address] [place], [address]  [place], [place] [address] Tel: [phone] Fax: [phone] Test Date:  01/28/2023  Patient: Colton Garcia DOB: 20-Mar-1951 Physician: Nita Sickle, DO  Sex: Male Height: 5\' 11"  Ref Phys: Nita Sickle, DO  ID#: [id number]   Technician:    History: This is a 72 year-old man referred for evaluation of right leg paresthesias.  NCV & EMG Findings: Extensive electrodiagnostic testing of the right lower extremity shows:  Right sural and superficial peroneal sensory responses are borderline low. Right peroneal motor response at the extensor digitorum brevis and tibialis anterior shows slowed conduction velocity across the fibular head (Poplt-B Fib, R33, R33 m/s).  Right tibial motor responses within normal limits.   Right tibial H reflex study is within normal limits. Chronic motor axon loss changes are seen affecting the right tibialis anterior, extensor hallucis longus, and fibularis longus muscles, without accompanying active denervation.  Needle electrode examination of the flexor digitorum longus and gluteus medius muscles is within normal limits.    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 There is no evidence of a large fiber sensorimotor polyneuropathy or lumbosacral radiculopathy.   ___________________________ Nita Sickle, DO    Nerve Conduction Studies   Stim Site NR Peak (ms) Norm Peak (ms) O-P Amp (V) Norm O-P Amp  Right Sup Peroneal Anti Sensory (Ant Lat Mall)  32 C  12 cm    2.2 <4.6 3.3 >3  Right Sural Anti Sensory (Lat Mall)  32 C  Calf    3.2 <4.6 4.7 >3     Stim Site NR Onset (ms) Norm Onset (ms) O-P Amp (mV) Norm O-P Amp Site1 Site2 Delta-0 (ms) Dist (cm) Vel (m/s) Norm Vel (m/s)  Right Peroneal Motor (Ext Dig Brev)  32 C  Ankle    4.1 <6.0 3.3 >2.5 B Fib Ankle 9.1 39.0 43 >40  B Fib    13.2  3.1  Poplt B Fib 2.7 9.0 *33 >40  Poplt    15.9  3.0          Right Peroneal TA Motor (Tib Ant)  32 C  Fib Head    3.0 <4.5 6.3 >3 Poplit Fib Head 2.7 9.0 *33 >40  Poplit    5.7 <5.7 6.3         Right Tibial Motor (Abd Hall Brev)  32 C  Ankle    4.1 <6.0 6.6 >4 Knee Ankle 10.2 45.0 44 >40  Knee    14.3  4.1          Electromyography   Side Muscle Ins.Act Fibs Fasc Recrt Amp Dur Poly Activation Comment  Right AntTibialis Nml Nml Nml *1- *1+ *1+ *1+ Nml N/A  Right Gastroc Nml Nml Nml Nml Nml Nml Nml Nml N/A  Right Flex Dig Long Nml Nml Nml Nml Nml Nml Nml Nml N/A  Right RectFemoris Nml Nml Nml Nml Nml Nml Nml Nml N/A  Right BicepsFemS Nml Nml Nml Nml Nml Nml Nml Nml N/A  Right GluteusMed Nml Nml Nml Nml Nml Nml Nml Nml N/A  Right ExtHallLong Nml Nml Nml *1- *1+ *1+ *1+ Nml N/A  Right Fibularis Long Nml Nml Nml *1- *1+ *1+ *1+ Nml N/A      Waveforms:

## 2023-01-28 NOTE — Progress Notes (Signed)
Introduced myself to the patient as the prostate nurse navigator.  No barriers to care identified at this time.  He is here to discuss his radiation treatment options, and has confirmed he would like to have brachytherapy.  I gave him my business card and asked him to call me with questions or concerns.  Verbalized understanding.

## 2023-02-09 ENCOUNTER — Telehealth: Payer: Self-pay | Admitting: *Deleted

## 2023-02-09 NOTE — Telephone Encounter (Signed)
Called patient to update, spoke with patient. 

## 2023-02-18 ENCOUNTER — Telehealth: Payer: Self-pay | Admitting: *Deleted

## 2023-02-18 NOTE — Telephone Encounter (Signed)
CALLED PATIENT TO INFORM OF IMPLANT DATE, SPOKE WITH PATIENT 

## 2023-02-23 ENCOUNTER — Telehealth: Payer: Self-pay | Admitting: *Deleted

## 2023-02-23 NOTE — Telephone Encounter (Signed)
CALLED PATIENT TO REMIND OF PRE-SEED APPTS. FOR 02-24-23, LVM FOR A RETURN CALL

## 2023-02-24 ENCOUNTER — Ambulatory Visit
Admission: RE | Admit: 2023-02-24 | Discharge: 2023-02-24 | Disposition: A | Discharge status: Home / Self Care | Payer: Medicare Other | Source: Ambulatory Visit | Attending: Urology | Admitting: Urology

## 2023-02-24 ENCOUNTER — Other Ambulatory Visit: Payer: Self-pay

## 2023-02-24 ENCOUNTER — Ambulatory Visit
Admission: RE | Admit: 2023-02-24 | Discharge: 2023-02-24 | Disposition: A | Discharge status: Home / Self Care | Payer: Medicare Other | Source: Ambulatory Visit | Attending: Radiation Oncology | Admitting: Radiation Oncology

## 2023-02-24 ENCOUNTER — Encounter: Payer: Self-pay | Admitting: Urology

## 2023-02-24 VITALS — Resp 19 | Ht 71.0 in | Wt 157.0 lb

## 2023-02-24 DIAGNOSIS — Z510 Encounter for antineoplastic radiation therapy: Secondary | ICD-10-CM | POA: Diagnosis not present

## 2023-02-24 DIAGNOSIS — C61 Malignant neoplasm of prostate: Secondary | ICD-10-CM | POA: Insufficient documentation

## 2023-02-24 NOTE — Progress Notes (Signed)
Pre-seed nursing interview for a diagnosis of Stage T1c adenocarcinoma of the prostate with Gleason score of 3+4, and PSA of 6.  Patient identity verified x2. Patient reports polyuria, and nocturia x 4-5 (see I-PSS score below). No other issues conveyed at this time.  Meaningful use complete.  I-PSS score 16- Moderate Urinary Management medication(s)- None Urology appointment date- None  currently, with Dr. Retta Diones at [facility] [place]  Resp 19   Ht 5\' 11"  (1.803 m)   Wt 157 lb (71.2 kg)   BMI 21.90 kg/m   This concludes the interview.   Ruel Favors, LPN

## 2023-02-24 NOTE — Progress Notes (Signed)
  Radiation Oncology         [phone] ________________________________  Name: Colton Garcia MRN: [id number]  Date: 02/24/2023  DOB: Feb 01, 1951  SIMULATION AND TREATMENT PLANNING NOTE PUBIC ARCH STUDY  WJ:[id number], Adult And Pediatric  Marcine Matar, MD  DIAGNOSIS:  72 y.o. gentleman with Stage T1c adenocarcinoma of the prostate with Gleason score of 3+4, and PSA of 6.   Oncology History  Malignant neoplasm of prostate (HCC)  01/17/2023 Cancer Staging   Staging form: Prostate, AJCC 8th Edition - Clinical stage from 01/17/2023: Stage IIB (cT1c, cN0, cM0, PSA: 6.1, Grade Group: 2) - Signed by Marcello Fennel, PA-C on 01/27/2023 Histopathologic type: Adenocarcinoma, NOS Stage prefix: Initial diagnosis Prostate specific antigen (PSA) range: Less than 10 Gleason primary pattern: 3 Gleason secondary pattern: 4 Gleason score: 7 Histologic grading system: 5 grade system Number of biopsy cores examined: 12 Number of biopsy cores positive: 3 Location of positive needle core biopsies: Both sides   01/27/2023 Initial Diagnosis   Malignant neoplasm of prostate (HCC)       ICD-10-CM   1. Malignant neoplasm of prostate (HCC)  C61       COMPLEX SIMULATION:  The patient presented today for evaluation for possible prostate seed implant. He was brought to the radiation planning suite and placed supine on the CT couch. A 3-dimensional image study set was obtained in upload to the planning computer. There, on each axial slice, I contoured the prostate gland. Then, using three-dimensional radiation planning tools I reconstructed the prostate in view of the structures from the transperineal needle pathway to assess for possible pubic arch interference. In doing so, I did not appreciate any pubic arch interference. Also, the patient's prostate volume was estimated based on the drawn structure. The volume was 22 cc.  Given the pubic arch appearance and prostate volume, patient remains a good  candidate to proceed with prostate seed implant. Today, he freely provided informed written consent to proceed.    PLAN: The patient will undergo prostate seed implant.   ________________________________  Artist Pais. Kathrynn Running, M.D.

## 2023-03-01 ENCOUNTER — Telehealth: Payer: Self-pay | Admitting: *Deleted

## 2023-03-01 NOTE — Telephone Encounter (Signed)
RETURNED PATIENT'S PHONE CALL, SPOKE WITH PATIENT. ?

## 2023-03-02 ENCOUNTER — Other Ambulatory Visit: Payer: Self-pay | Admitting: Urology

## 2023-03-02 ENCOUNTER — Telehealth: Payer: Self-pay | Admitting: *Deleted

## 2023-03-02 NOTE — Telephone Encounter (Signed)
03/01/2023 Late entry. Connected with [employer] [phone] (home) as requested per voicemail requesting to speak with Augusto Garbe, RN about FMLA.  Advised of forms process, ROI, [employer] Cover Sheet and 7-10 business days (14 calendar) to process.  "I will have seed implants in August,  Trying to get everything I need for employer taken care of.  I can be there Monday at 10:00 am to bring forms to you and sign cover sheet and ROI giving you a month to process."  Denies further questions or needs.  Awaiting  meeting with MALAQUIAS MANCA Monday, 03/07/2023 at 10:00 am.
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
## 2023-03-07 NOTE — Telephone Encounter (Signed)
Remote forms nurse reported to this nurse [phone] receipt of registrar secure chat regarding appointment at 10:00 am with Colton Garcia.  Registrar reports Master Schedule reads this nurse out on PAL.   Advised to have patient wait in office if away from desk.   Connected with Colton Garcia, [phone] (home) and apologized for any inconvenience.  In office yet we missed each other.   "I am off work every Monday so I will try next Monday, July 1st at 10:00 am to ensure form gets in the right hands."

## 2023-03-28 ENCOUNTER — Telehealth: Payer: Self-pay | Admitting: *Deleted

## 2023-03-28 NOTE — Telephone Encounter (Signed)
 RETURNED PATIENT'S PHONE CALL, SPOKE WITH PATIENT. ?

## 2023-03-28 NOTE — Progress Notes (Signed)
Patient awaiting copy of FMLA forms. Patient signed Release of Information form. Completed Intermittent FMLA portion of forms as requested by Patient. Copy of forms provided to Patient as requested. Copy of forms forwarded to Health Information Management. No other needs or concerns voiced at this time.

## 2023-03-31 ENCOUNTER — Encounter (HOSPITAL_BASED_OUTPATIENT_CLINIC_OR_DEPARTMENT_OTHER): Payer: Self-pay | Admitting: Urology

## 2023-04-04 ENCOUNTER — Other Ambulatory Visit: Payer: Self-pay

## 2023-04-04 ENCOUNTER — Encounter (HOSPITAL_BASED_OUTPATIENT_CLINIC_OR_DEPARTMENT_OTHER): Payer: Self-pay | Admitting: Urology

## 2023-04-04 NOTE — Progress Notes (Signed)
Spoke w/ via phone for pre-op interview---pt Lab needs dos---- I stat              Lab results------EKG 04-26-2022 epic, lov vascular dr Myra Gianotti  05-31-2022, lov cardiology dr Allyson Sabal 04-26-2022 epic COVID test -----patient states asymptomatic no test needed Arrive at -------900 04-21-2023 NPO after MN NO Solid Food.  Clear liquids from MN until---800 Med rec completed Medications to take morning of surgery -----gabapentin Diabetic medication -----none day of surgery Patient instructed no nail polish to be worn day of surgery Patient instructed to bring photo id and insurance card day of surgery Patient aware to have Driver (ride ) / caregiver   daughter michellle black-scott  for 24 hours after surgery  Patient Special Instructions -----fleets enema am of surgery Pre-Op special Instructions -----none Patient verbalized understanding of instructions that were given at this phone interview. Patient denies shortness of breath, chest pain, fever, cough at this phone interview.

## 2023-04-20 ENCOUNTER — Telehealth: Payer: Self-pay | Admitting: *Deleted

## 2023-04-20 NOTE — Telephone Encounter (Signed)
CALLED PATIENT TO REMIND OF PROCEDURE FOR 04-21-23, LVM FOR A  RETURN CALL

## 2023-04-21 ENCOUNTER — Encounter (HOSPITAL_BASED_OUTPATIENT_CLINIC_OR_DEPARTMENT_OTHER)
Admission: RE | Disposition: A | Discharge status: Home / Self Care | Payer: Self-pay | Source: Home / Self Care | Attending: Urology

## 2023-04-21 ENCOUNTER — Ambulatory Visit (HOSPITAL_BASED_OUTPATIENT_CLINIC_OR_DEPARTMENT_OTHER): Payer: Medicare Other | Admitting: Anesthesiology

## 2023-04-21 ENCOUNTER — Encounter (HOSPITAL_BASED_OUTPATIENT_CLINIC_OR_DEPARTMENT_OTHER): Payer: Self-pay | Admitting: Urology

## 2023-04-21 ENCOUNTER — Other Ambulatory Visit: Payer: Self-pay

## 2023-04-21 ENCOUNTER — Ambulatory Visit (HOSPITAL_BASED_OUTPATIENT_CLINIC_OR_DEPARTMENT_OTHER)
Admission: RE | Admit: 2023-04-21 | Discharge: 2023-04-21 | Disposition: A | Discharge status: Home / Self Care | Payer: Medicare Other | Attending: Urology | Admitting: Urology

## 2023-04-21 DIAGNOSIS — C61 Malignant neoplasm of prostate: Secondary | ICD-10-CM | POA: Diagnosis present

## 2023-04-21 DIAGNOSIS — Z79899 Other long term (current) drug therapy: Secondary | ICD-10-CM | POA: Diagnosis not present

## 2023-04-21 DIAGNOSIS — Z01818 Encounter for other preprocedural examination: Secondary | ICD-10-CM

## 2023-04-21 DIAGNOSIS — Z539 Procedure and treatment not carried out, unspecified reason: Secondary | ICD-10-CM | POA: Diagnosis not present

## 2023-04-21 HISTORY — DX: Inflammatory liver disease, unspecified: K75.9

## 2023-04-21 HISTORY — DX: Unspecified osteoarthritis, unspecified site: M19.90

## 2023-04-21 HISTORY — DX: Chronic kidney disease, unspecified: N18.9

## 2023-04-21 HISTORY — DX: Type 2 diabetes mellitus with diabetic neuropathy, unspecified: E11.40

## 2023-04-21 LAB — POCT I-STAT, CHEM 8
BUN: 29 mg/dL — ABNORMAL HIGH (ref 8–23)
Calcium, Ion: 1.14 mmol/L — ABNORMAL LOW (ref 1.15–1.40)
Chloride: 108 mmol/L (ref 98–111)
Creatinine, Ser: 1.7 mg/dL — ABNORMAL HIGH (ref 0.61–1.24)
Glucose, Bld: 104 mg/dL — ABNORMAL HIGH (ref 70–99)
HCT: 45 % (ref 39.0–52.0)
Hemoglobin: 15.3 g/dL (ref 13.0–17.0)
Potassium: 5 mmol/L (ref 3.5–5.1)
Sodium: 138 mmol/L (ref 135–145)
TCO2: 22 mmol/L (ref 22–32)

## 2023-04-21 LAB — SURGICAL PCR SCREEN
MRSA, PCR: NEGATIVE
Staphylococcus aureus: NEGATIVE

## 2023-04-21 SURGERY — INSERTION, RADIATION SOURCE, PROSTATE
Anesthesia: General

## 2023-04-21 MED ORDER — FENTANYL CITRATE (PF) 100 MCG/2ML IJ SOLN
INTRAMUSCULAR | Status: AC
  Filled 2023-04-21: qty 2

## 2023-04-21 MED ORDER — LACTATED RINGERS IV SOLN: INTRAVENOUS | Status: DC

## 2023-04-21 MED ORDER — MUPIROCIN 2 % EX OINT
1.0000 | TOPICAL_OINTMENT | Freq: Two times a day (BID) | CUTANEOUS | Status: DC
  Administered 2023-04-21: 1 via NASAL
  Filled 2023-04-21: qty 22

## 2023-04-21 MED ORDER — CEFAZOLIN SODIUM-DEXTROSE 2-4 GM/100ML-% IV SOLN
INTRAVENOUS | Status: AC
  Filled 2023-04-21: qty 100

## 2023-04-21 MED ORDER — PROPOFOL 10 MG/ML IV BOLUS
INTRAVENOUS | Status: AC
  Filled 2023-04-21: qty 20

## 2023-04-21 MED ORDER — ACETAMINOPHEN 500 MG PO TABS
ORAL_TABLET | ORAL | Status: AC
  Filled 2023-04-21: qty 2

## 2023-04-21 MED ORDER — FLEET ENEMA 7-19 GM/118ML RE ENEM: 1.0000 | ENEMA | Freq: Once | RECTAL | Status: DC

## 2023-04-21 MED ORDER — CEFAZOLIN SODIUM-DEXTROSE 2-4 GM/100ML-% IV SOLN: 2.0000 g | Freq: Once | INTRAVENOUS | Status: DC

## 2023-04-21 MED ORDER — SODIUM CHLORIDE 0.9 % IV SOLN
INTRAVENOUS | Status: DC
  Administered 2023-04-21: 1000 mL via INTRAVENOUS

## 2023-04-21 MED ORDER — ACETAMINOPHEN 500 MG PO TABS
1000.0000 mg | ORAL_TABLET | Freq: Once | ORAL | Status: AC
  Administered 2023-04-21: 1000 mg via ORAL

## 2023-04-21 SURGICAL SUPPLY — 50 items
BAG DRAIN URO-CYSTO SKYTR STRL (DRAIN) ×1 IMPLANT
BAG DRN RND TRDRP ANRFLXCHMBR (UROLOGICAL SUPPLIES)
BAG DRN UROCATH (DRAIN)
BAG URINE DRAIN 2000ML AR STRL (UROLOGICAL SUPPLIES) ×1 IMPLANT
BLADE CLIPPER SENSICLIP SURGIC (BLADE) ×1 IMPLANT
CATH FOLEY 2WAY SLVR 5CC 16FR (CATHETERS) ×1 IMPLANT
CATH ROBINSON RED A/P 16FR (CATHETERS) IMPLANT
CATH ROBINSON RED A/P 20FR (CATHETERS) ×1 IMPLANT
CATH URET 5FR 70CM CONE TIP (BALLOONS) IMPLANT
CATH URETL OPEN 5X70 (CATHETERS) IMPLANT
CLOTH BEACON ORANGE TIMEOUT ST (SAFETY) ×2 IMPLANT
CNTNR URN SCR LID CUP LEK RST (MISCELLANEOUS) ×1 IMPLANT
CONT SPEC 4OZ STRL OR WHT (MISCELLANEOUS)
COVER BACK TABLE 60X90IN (DRAPES) ×1 IMPLANT
COVER MAYO STAND STRL (DRAPES) ×1 IMPLANT
DRSG TEGADERM 4X4.75 (GAUZE/BANDAGES/DRESSINGS) ×1 IMPLANT
DRSG TEGADERM 8X12 (GAUZE/BANDAGES/DRESSINGS) ×1 IMPLANT
GEL ULTRASOUND 20GR AQUASONIC (MISCELLANEOUS) ×2 IMPLANT
GLOVE BIO SURGEON STRL SZ 6.5 (GLOVE) IMPLANT
GLOVE BIO SURGEON STRL SZ7.5 (GLOVE) ×2 IMPLANT
GLOVE BIO SURGEON STRL SZ8 (GLOVE) ×1 IMPLANT
GLOVE BIOGEL PI IND STRL 6.5 (GLOVE) IMPLANT
GLOVE SURG ORTHO 8.5 STRL (GLOVE) ×1 IMPLANT
GOWN STRL REUS W/TWL LRG LVL3 (GOWN DISPOSABLE) ×1 IMPLANT
GOWN STRL REUS W/TWL XL LVL3 (GOWN DISPOSABLE) ×1 IMPLANT
GRID BRACH TEMP 18GA 2.8X3X.75 (MISCELLANEOUS) ×1 IMPLANT
GUIDEWIRE STR DUAL SENSOR (WIRE) IMPLANT
HOLDER FOLEY CATH W/STRAP (MISCELLANEOUS) ×1 IMPLANT
IV NS 1000ML (IV SOLUTION)
IV NS 1000ML BAXH (IV SOLUTION) ×2 IMPLANT
KIT TURNOVER CYSTO (KITS) ×1 IMPLANT
MANIFOLD NEPTUNE II (INSTRUMENTS) IMPLANT
NDL BRACHY 18G 5PK (NEEDLE) ×4 IMPLANT
NDL BRACHY 18G SINGLE (NEEDLE) IMPLANT
NDL PK MORGANSTERN STABILIZ (NEEDLE) ×1 IMPLANT
NEEDLE BRACHY 18G 5PK (NEEDLE)
NEEDLE BRACHY 18G SINGLE (NEEDLE)
NEEDLE PK MORGANSTERN STABILIZ (NEEDLE)
NS IRRIG 500ML POUR BTL (IV SOLUTION) IMPLANT
PACK CYSTO (CUSTOM PROCEDURE TRAY) ×1 IMPLANT
SHEATH ULTRASOUND LF (SHEATH) IMPLANT
SHEATH ULTRASOUND LTX NONSTRL (SHEATH) IMPLANT
SLEEVE SCD COMPRESS KNEE MED (STOCKING) ×1 IMPLANT
SUT BONE WAX W31G (SUTURE) IMPLANT
SYR 10ML LL (SYRINGE) ×1 IMPLANT
TOWEL OR 17X24 6PK STRL BLUE (TOWEL DISPOSABLE) ×1 IMPLANT
TUBE CONNECTING 12X1/4 (SUCTIONS) IMPLANT
UNDERPAD 30X36 HEAVY ABSORB (UNDERPADS AND DIAPERS) ×2 IMPLANT
WATER STERILE IRR 3000ML UROMA (IV SOLUTION) ×1 IMPLANT
WATER STERILE IRR 500ML POUR (IV SOLUTION) ×1 IMPLANT

## 2023-04-21 NOTE — H&P (Signed)
7.3.2023: TRUS/Bx. PSA 4.1, prostate volume 17.6 mL. PSAD 0.023. 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  Shim score 2   He decided to proceed w/ AS.   11.6.2023: PSA 4.94.   5.6.2024: Here for surveillance TRUS/Bx   04/04/2023:  Patient presents with his daughter at his side for preoperative clearance on upcoming brachytherapy and SpaceOAR scheduled for 8/8. He continues on sildenafil as needed. Since last seen, patient has had no changes to overall health nor baseline voiding function. He has no concerns, and few questions about upcoming procedure. He states that he is well-informed on what to expect. Today, patient denies irritative symptoms, gross hematuria, flank pain, fever/chills, nausea/vomiting.     ALLERGIES: No Known Allergies    MEDICATIONS: Levofloxacin 750 mg tablet 1 tablet PO Morning of procedure  Viagra 100 mg tablet 1/2 to 1 tab po as needed  Cetirizine Hcl 10 mg tablet 1 tablet PO Daily  Endocet 10 mg-325 mg tablet 1 tablet PO PRN  Gabapentin 300 mg capsule 1 capsule PO Daily  Glipizide 5 mg tablet 1 tablet PO Daily  Lisinopril-Hydrochlorothiazide 20 mg-25 mg tablet 1 tablet PO Daily  Meloxicam 7.5 mg tablet 1 tablet PO Daily  Pravastatin Sodium 20 mg tablet 1 tablet PO Daily     GU PSH: Prostate Needle Biopsy - 01/17/2023, 03/15/2022, 10/26/2021     NON-GU PSH: Surgical Pathology, Gross And Microscopic Examination For Prostate Needle - 01/17/2023, 03/15/2022, 10/26/2021     GU PMH: Prostate Cancer - 01/17/2023, Low volume grade group 1 prostate cancer on active surveillance. PSA up slightly as from his prebiopsy PSA, - 07/19/2022, Low volume grade group 1 prostate cancer. Nomogram predictions- Organ confined disease--83% Lymph node/seminal vesicle involvement--1% each 5/10-year progression free probability with  prostatectomy-95/91%, - 03/29/2022 Elevated PSA - 03/15/2022, - 10/26/2021, Small prostate, no nodularity. However, I feel that he has a high PSA density and would recommend ultrasound and biopsy, - 08/24/2021 ED due to arterial insufficiency - 08/24/2021    NON-GU PMH: None   Immunizations: None   FAMILY HISTORY: None   SOCIAL HISTORY: Marital Status: Single    VITAL SIGNS: None   MULTI-SYSTEM PHYSICAL EXAMINATION:    Constitutional: Well-nourished. No physical deformities. Normally developed. Good grooming.  Respiratory: No labored breathing, no use of accessory muscles. Lungs clear to auscultation bilaterally. No rales, wheezing.  Cardiovascular: Normal temperature, normal extremity pulses. Regular rate and rhythm. No murmurs, gallops.  Lymphatic: No enlargement of neck, axillae, groin.  Neurologic / Psychiatric: Oriented to time, oriented to place, oriented to person. No depression, no anxiety, no agitation.  Gastrointestinal: No mass, no tenderness, no rigidity, non obese abdomen.     Complexity of Data:  Source Of History:  Patient, Family/Caregiver, Medical Record Summary  Records Review:   Previous Doctor Records, Previous Patient Records  Urine Test Review:   Urinalysis   01/17/23 07/12/22 03/15/22 06/15/21 02/16/21 01/21/20  PSA  Total PSA 6.01 ng/mL 4.94 ng/mL 4.10 ng/mL 4.2 ng/ml 4.2 ng/ml 2.4 ng/ml  Free PSA   0.46 ng/mL     % Free PSA   11 % PSA       04/04/23  Urinalysis  Urine Appearance Clear   Urine Color Straw   Urine Glucose Neg mg/dL  Urine Bilirubin Neg mg/dL  Urine Ketones Neg  mg/dL  Urine Specific Gravity 1.020   Urine Blood Neg ery/uL  Urine pH 5.5   Urine Protein Neg mg/dL  Urine Urobilinogen 0.2 mg/dL  Urine Nitrites Neg   Urine Leukocyte Esterase Trace leu/uL  Urine WBC/hpf 6 - 10/hpf   Urine RBC/hpf NS (Not Seen)   Urine Epithelial Cells NS (Not Seen)   Urine Bacteria Rare (0-9/hpf)   Urine Mucous Not Present   Urine Yeast NS (Not Seen)    Urine Trichomonas Not Present   Urine Cystals NS (Not Seen)   Urine Casts NS (Not Seen)   Urine Sperm Not Present    PROCEDURES:          Urinalysis w/Scope Dipstick Dipstick Cont'd Micro  Color: Straw Bilirubin: Neg mg/dL WBC/hpf: 6 - 16/XWR  Appearance: Clear Ketones: Neg mg/dL RBC/hpf: NS (Not Seen)  Specific Gravity: 1.020 Blood: Neg ery/uL Bacteria: Rare (0-9/hpf)  pH: 5.5 Protein: Neg mg/dL Cystals: NS (Not Seen)  Glucose: Neg mg/dL Urobilinogen: 0.2 mg/dL Casts: NS (Not Seen)    Nitrites: Neg Trichomonas: Not Present    Leukocyte Esterase: Trace leu/uL Mucous: Not Present      Epithelial Cells: NS (Not Seen)      Yeast: NS (Not Seen)      Sperm: Not Present    ASSESSMENT:      ICD-10 Details  1 GU:   Prostate Cancer - C61 Chronic, Worsening  2   BPH w/LUTS - N40.1 Chronic, Stable   PLAN:           Orders Labs Urine Culture          Schedule Return Visit/Planned Activity: Keep Scheduled Appointment             Note: Brachytherapy and SpaceOAR on 8/8          Document Letter(s):  Created for Patient: Clinical Summary         Notes:   To date, UA with leukocytosis, rare bacteria. We will send for presurgical clearance and follow-up with patient as appropriate. We will withhold empiric therapy at this time.   We reviewed with patient and his daughter specifics of surgery and of the day. We answered all the questions to the best of our abilities. Patient was well-informed on procedure, and had few questions regarding it. He did inquire about a work excuse note. We advised that we are happy to provide an excuse from work postoperatively, if needed.   Maintain upcoming procedure for brachytherapy and SpaceOAR on 8/8. Patient knows to inform this office of any interim changes to general health or baseline voiding function. For now, continue on meloxicam, sildenafil as needed. Patient and his daughter both voiced understanding and are amenable to this plan.

## 2023-04-21 NOTE — Interval H&P Note (Signed)
History and Physical Interval Note:  04/21/2023 10:59 AM  Colton Garcia  has presented today for surgery, with the diagnosis of PROSTATE CANCER.  The various methods of treatment have been discussed with the patient and family. After consideration of risks, benefits and other options for treatment, the patient has consented to  Procedure(s): RADIOACTIVE SEED IMPLANT/BRACHYTHERAPY IMPLANT (N/A) SPACE OAR INSTILLATION (N/A) CYSTOSCOPY FLEXIBLE (N/A) as a surgical intervention.  The patient's history has been reviewed, patient examined, no change in status, stable for surgery.  I have reviewed the patient's chart and labs.  Questions were answered to the patient's satisfaction.     Crist Fat

## 2023-04-21 NOTE — Interval H&P Note (Signed)
History and Physical Interval Note:  04/21/2023 11:20 AM  Colton Garcia  has presented today for surgery, with the diagnosis of PROSTATE CANCER.  The various methods of treatment have been discussed with the patient and family. After consideration of risks, benefits and other options for treatment, the patient has consented to  Procedure(s): RADIOACTIVE SEED IMPLANT/BRACHYTHERAPY IMPLANT (N/A) SPACE OAR INSTILLATION (N/A) CYSTOSCOPY FLEXIBLE (N/A) as a surgical intervention.  The patient's history has been reviewed, patient examined, no change in status, stable for surgery.  I have reviewed the patient's chart and labs.  Questions were answered to the patient's satisfaction.     Crist Fat

## 2023-04-21 NOTE — Anesthesia Preprocedure Evaluation (Deleted)
Anesthesia Evaluation  Patient identified by MRN, date of birth, ID band Patient awake    Reviewed: Allergy & Precautions, NPO status , Patient's Chart, lab work & pertinent test results  Airway Mallampati: I  TM Distance: >3 FB Neck ROM: Full    Dental  (+) Edentulous Upper, Missing   Pulmonary Current SmokerPatient did not abstain from smoking.   Pulmonary exam normal        Cardiovascular hypertension, Pt. on medications + Peripheral Vascular Disease  Normal cardiovascular exam     Neuro/Psych  Headaches Diabetic neuropathy   negative psych ROS   GI/Hepatic negative GI ROS,,,(+) Hepatitis -  Endo/Other  diabetes, Oral Hypoglycemic Agents    Renal/GU Renal InsufficiencyRenal disease     Musculoskeletal  (+) Arthritis ,    Abdominal   Peds  Hematology negative hematology ROS (+)   Anesthesia Other Findings PROSTATE CANCER  Reproductive/Obstetrics                             Anesthesia Physical Anesthesia Plan  ASA: 3  Anesthesia Plan: General   Post-op Pain Management:    Induction: Intravenous  PONV Risk Score and Plan: 1 and Ondansetron, Dexamethasone, Midazolam and Treatment may vary due to age or medical condition  Airway Management Planned: Oral ETT  Additional Equipment:   Intra-op Plan:   Post-operative Plan: Extubation in OR  Informed Consent: I have reviewed the patients History and Physical, chart, labs and discussed the procedure including the risks, benefits and alternatives for the proposed anesthesia with the patient or authorized representative who has indicated his/her understanding and acceptance.     Dental advisory given  Plan Discussed with: CRNA  Anesthesia Plan Comments:        Anesthesia Quick Evaluation
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
## 2023-04-22 ENCOUNTER — Telehealth: Payer: Self-pay | Admitting: *Deleted

## 2023-04-22 NOTE — Telephone Encounter (Signed)
CALLED PATIENT TO INFORM OF NEW IMPLANT DATE, SPOKE WITH PATIENT AND HE IS AWARE OF THIS NEW DATE

## 2023-04-25 ENCOUNTER — Encounter (HOSPITAL_BASED_OUTPATIENT_CLINIC_OR_DEPARTMENT_OTHER): Payer: Self-pay | Admitting: Urology

## 2023-04-25 ENCOUNTER — Other Ambulatory Visit: Payer: Self-pay

## 2023-04-25 NOTE — Progress Notes (Signed)
Spoke w/ via phone for pre-op interview---[facility] Lab needs dos---- none              Lab results------04/21/23 ISTAT, LOV vascular Dr. Myra Gianotti 05/31/22, LOV cardiology Dr. Allyson Sabal 04/26/22 in Epic COVID test -----patient states asymptomatic no test needed Arrive at -------0900 on Wednesday, 04/27/23 NPO after MN NO Solid Food.  Clear liquids from MN until---0800 Med rec completed Medications to take morning of surgery -----Gabapentin Diabetic medication -----Do not take Glipizide on the morning of surgery. Patient instructed no nail polish to be worn day of surgery Patient instructed to bring photo id and insurance card day of surgery Patient aware to have Driver (ride ) / caregiver    for 24 hours after surgery - daughter, Missy Patient Special Instructions -----Do Fleet's enema morning of surgery. Pre-Op special Instructions -----none Patient verbalized understanding of instructions that were given at this phone interview. Patient denies shortness of breath, chest pain, fever, cough at this phone interview.

## 2023-04-26 ENCOUNTER — Telehealth: Payer: Self-pay | Admitting: *Deleted

## 2023-04-26 NOTE — Telephone Encounter (Signed)
CALLED PATIENT TO REMIND OF PROCEDURE FOR 04-27-23, LVM FOR A RETURN CALL

## 2023-04-27 ENCOUNTER — Ambulatory Visit (HOSPITAL_BASED_OUTPATIENT_CLINIC_OR_DEPARTMENT_OTHER): Payer: Medicare Other | Admitting: Certified Registered Nurse Anesthetist

## 2023-04-27 ENCOUNTER — Encounter (HOSPITAL_BASED_OUTPATIENT_CLINIC_OR_DEPARTMENT_OTHER)
Admission: RE | Disposition: A | Discharge status: Home / Self Care | Payer: Self-pay | Source: Home / Self Care | Attending: Urology

## 2023-04-27 ENCOUNTER — Encounter (HOSPITAL_BASED_OUTPATIENT_CLINIC_OR_DEPARTMENT_OTHER): Payer: Self-pay | Admitting: Urology

## 2023-04-27 ENCOUNTER — Ambulatory Visit (HOSPITAL_BASED_OUTPATIENT_CLINIC_OR_DEPARTMENT_OTHER)
Admission: RE | Admit: 2023-04-27 | Discharge: 2023-04-27 | Disposition: A | Discharge status: Home / Self Care | Payer: Medicare Other | Attending: Urology | Admitting: Urology

## 2023-04-27 ENCOUNTER — Ambulatory Visit (HOSPITAL_COMMUNITY): Payer: Medicare Other

## 2023-04-27 ENCOUNTER — Other Ambulatory Visit: Payer: Self-pay

## 2023-04-27 DIAGNOSIS — N400 Enlarged prostate without lower urinary tract symptoms: Secondary | ICD-10-CM | POA: Diagnosis not present

## 2023-04-27 DIAGNOSIS — Z510 Encounter for antineoplastic radiation therapy: Secondary | ICD-10-CM | POA: Diagnosis not present

## 2023-04-27 DIAGNOSIS — E1151 Type 2 diabetes mellitus with diabetic peripheral angiopathy without gangrene: Secondary | ICD-10-CM | POA: Diagnosis not present

## 2023-04-27 DIAGNOSIS — C61 Malignant neoplasm of prostate: Secondary | ICD-10-CM | POA: Insufficient documentation

## 2023-04-27 DIAGNOSIS — F17210 Nicotine dependence, cigarettes, uncomplicated: Secondary | ICD-10-CM | POA: Diagnosis not present

## 2023-04-27 DIAGNOSIS — Z01818 Encounter for other preprocedural examination: Secondary | ICD-10-CM

## 2023-04-27 DIAGNOSIS — I10 Essential (primary) hypertension: Secondary | ICD-10-CM

## 2023-04-27 HISTORY — PX: CYSTOSCOPY: SHX5120

## 2023-04-27 HISTORY — PX: SPACE OAR INSTILLATION: SHX6769

## 2023-04-27 HISTORY — PX: RADIOACTIVE SEED IMPLANT: SHX5150

## 2023-04-27 LAB — GLUCOSE, CAPILLARY
Glucose-Capillary: 103 mg/dL — ABNORMAL HIGH (ref 70–99)
Glucose-Capillary: 97 mg/dL (ref 70–99)

## 2023-04-27 SURGERY — INSERTION, RADIATION SOURCE, PROSTATE
Anesthesia: General | Site: Prostate

## 2023-04-27 MED ORDER — DEXAMETHASONE SODIUM PHOSPHATE 10 MG/ML IJ SOLN
INTRAMUSCULAR | Status: AC
  Filled 2023-04-27: qty 1

## 2023-04-27 MED ORDER — PROPOFOL 10 MG/ML IV BOLUS
INTRAVENOUS | Status: AC
  Filled 2023-04-27: qty 20

## 2023-04-27 MED ORDER — STERILE WATER FOR IRRIGATION IR SOLN
Status: DC | PRN
  Administered 2023-04-27: 500 mL

## 2023-04-27 MED ORDER — ROCURONIUM BROMIDE 10 MG/ML (PF) SYRINGE
PREFILLED_SYRINGE | INTRAVENOUS | Status: DC | PRN
  Administered 2023-04-27: 50 mg via INTRAVENOUS

## 2023-04-27 MED ORDER — MIDAZOLAM HCL 2 MG/2ML IJ SOLN
INTRAMUSCULAR | Status: AC
  Filled 2023-04-27: qty 2

## 2023-04-27 MED ORDER — OXYCODONE HCL 5 MG PO TABS: 5.0000 mg | ORAL_TABLET | Freq: Once | ORAL | Status: DC | PRN

## 2023-04-27 MED ORDER — SUGAMMADEX SODIUM 200 MG/2ML IV SOLN
INTRAVENOUS | Status: DC | PRN
  Administered 2023-04-27: 200 mg via INTRAVENOUS

## 2023-04-27 MED ORDER — FENTANYL CITRATE (PF) 100 MCG/2ML IJ SOLN
INTRAMUSCULAR | Status: AC
  Filled 2023-04-27: qty 2

## 2023-04-27 MED ORDER — TRAMADOL HCL 50 MG PO TABS: 50.0000 mg | ORAL_TABLET | Freq: Four times a day (QID) | ORAL | 0 refills | Status: DC | PRN

## 2023-04-27 MED ORDER — SODIUM CHLORIDE (PF) 0.9 % IJ SOLN
INTRAMUSCULAR | Status: DC | PRN
  Administered 2023-04-27: 10 mL

## 2023-04-27 MED ORDER — SODIUM CHLORIDE 0.9 % IV SOLN: INTRAVENOUS | Status: DC

## 2023-04-27 MED ORDER — IOHEXOL 300 MG/ML  SOLN
INTRAMUSCULAR | Status: DC | PRN
  Administered 2023-04-27: 7 mL via URETHRAL

## 2023-04-27 MED ORDER — ONDANSETRON HCL 4 MG/2ML IJ SOLN
INTRAMUSCULAR | Status: DC | PRN
  Administered 2023-04-27: 4 mg via INTRAVENOUS

## 2023-04-27 MED ORDER — EPHEDRINE SULFATE (PRESSORS) 50 MG/ML IJ SOLN
INTRAMUSCULAR | Status: DC | PRN
  Administered 2023-04-27: 5 mg via INTRAVENOUS

## 2023-04-27 MED ORDER — FENTANYL CITRATE (PF) 250 MCG/5ML IJ SOLN
INTRAMUSCULAR | Status: DC | PRN
  Administered 2023-04-27: 50 ug via INTRAVENOUS
  Administered 2023-04-27 (×4): 25 ug via INTRAVENOUS

## 2023-04-27 MED ORDER — ROCURONIUM BROMIDE 10 MG/ML (PF) SYRINGE
PREFILLED_SYRINGE | INTRAVENOUS | Status: AC
  Filled 2023-04-27: qty 10

## 2023-04-27 MED ORDER — SODIUM CHLORIDE 0.9 % IR SOLN
Status: DC | PRN
  Administered 2023-04-27: 1000 mL

## 2023-04-27 MED ORDER — OXYCODONE HCL 5 MG/5ML PO SOLN: 5.0000 mg | Freq: Once | ORAL | Status: DC | PRN

## 2023-04-27 MED ORDER — MIDAZOLAM HCL 2 MG/2ML IJ SOLN
INTRAMUSCULAR | Status: DC | PRN
  Administered 2023-04-27: 1 mg via INTRAVENOUS

## 2023-04-27 MED ORDER — PHENYLEPHRINE 80 MCG/ML (10ML) SYRINGE FOR IV PUSH (FOR BLOOD PRESSURE SUPPORT)
PREFILLED_SYRINGE | INTRAVENOUS | Status: AC
  Filled 2023-04-27: qty 10

## 2023-04-27 MED ORDER — CIPROFLOXACIN IN D5W 400 MG/200ML IV SOLN
INTRAVENOUS | Status: AC
  Filled 2023-04-27: qty 200

## 2023-04-27 MED ORDER — LIDOCAINE HCL (PF) 2 % IJ SOLN
INTRAMUSCULAR | Status: AC
  Filled 2023-04-27: qty 5

## 2023-04-27 MED ORDER — TAMSULOSIN HCL 0.4 MG PO CAPS: 0.4000 mg | ORAL_CAPSULE | Freq: Every day | ORAL | 5 refills | Status: AC

## 2023-04-27 MED ORDER — CIPROFLOXACIN IN D5W 400 MG/200ML IV SOLN
400.0000 mg | Freq: Two times a day (BID) | INTRAVENOUS | Status: DC
  Administered 2023-04-27: 400 mg via INTRAVENOUS

## 2023-04-27 MED ORDER — PROPOFOL 10 MG/ML IV BOLUS
INTRAVENOUS | Status: DC | PRN
  Administered 2023-04-27: 140 mg via INTRAVENOUS
  Administered 2023-04-27: 20 mg via INTRAVENOUS

## 2023-04-27 MED ORDER — LIDOCAINE 2% (20 MG/ML) 5 ML SYRINGE
INTRAMUSCULAR | Status: DC | PRN
  Administered 2023-04-27: 100 mg via INTRAVENOUS

## 2023-04-27 MED ORDER — ONDANSETRON HCL 4 MG/2ML IJ SOLN: 4.0000 mg | Freq: Once | INTRAMUSCULAR | Status: DC | PRN

## 2023-04-27 MED ORDER — DEXAMETHASONE SODIUM PHOSPHATE 10 MG/ML IJ SOLN
INTRAMUSCULAR | Status: DC | PRN
  Administered 2023-04-27: 5 mg via INTRAVENOUS

## 2023-04-27 MED ORDER — PHENAZOPYRIDINE HCL 200 MG PO TABS: 200.0000 mg | ORAL_TABLET | Freq: Three times a day (TID) | ORAL | 0 refills | Status: DC | PRN

## 2023-04-27 MED ORDER — FENTANYL CITRATE (PF) 100 MCG/2ML IJ SOLN: 25.0000 ug | INTRAMUSCULAR | Status: DC | PRN

## 2023-04-27 MED ORDER — ONDANSETRON HCL 4 MG/2ML IJ SOLN
INTRAMUSCULAR | Status: AC
  Filled 2023-04-27: qty 2

## 2023-04-27 SURGICAL SUPPLY — 54 items
BAG DRAIN URO-CYSTO SKYTR STRL (DRAIN) ×3 IMPLANT
BAG DRN RND TRDRP ANRFLXCHMBR (UROLOGICAL SUPPLIES) ×2
BAG DRN UROCATH (DRAIN)
BAG URINE DRAIN 2000ML AR STRL (UROLOGICAL SUPPLIES) ×3 IMPLANT
BLADE CLIPPER SENSICLIP SURGIC (BLADE) ×3 IMPLANT
CATH FOLEY 2WAY SLVR 5CC 16FR (CATHETERS) ×3 IMPLANT
CATH ROBINSON RED A/P 16FR (CATHETERS) IMPLANT
CATH ROBINSON RED A/P 20FR (CATHETERS) ×3 IMPLANT
CATH URET 5FR 70CM CONE TIP (BALLOONS) IMPLANT
CATH URETL OPEN 5X70 (CATHETERS) IMPLANT
CLOTH BEACON ORANGE TIMEOUT ST (SAFETY) ×6 IMPLANT
CNTNR URN SCR LID CUP LEK RST (MISCELLANEOUS) ×3 IMPLANT
CONT SPEC 4OZ STRL OR WHT (MISCELLANEOUS)
COVER BACK TABLE 60X90IN (DRAPES) ×3 IMPLANT
COVER MAYO STAND STRL (DRAPES) ×3 IMPLANT
DRSG TEGADERM 4X4.75 (GAUZE/BANDAGES/DRESSINGS) ×3 IMPLANT
DRSG TEGADERM 8X12 (GAUZE/BANDAGES/DRESSINGS) ×3 IMPLANT
GAUZE SPONGE 4X4 12PLY STRL (GAUZE/BANDAGES/DRESSINGS) IMPLANT
GEL ULTRASOUND 20GR AQUASONIC (MISCELLANEOUS) ×6 IMPLANT
GLOVE BIO SURGEON STRL SZ 6.5 (GLOVE) IMPLANT
GLOVE BIO SURGEON STRL SZ7.5 (GLOVE) ×6 IMPLANT
GLOVE BIO SURGEON STRL SZ8 (GLOVE) ×3 IMPLANT
GLOVE BIOGEL PI IND STRL 6.5 (GLOVE) IMPLANT
GLOVE SURG ORTHO 8.5 STRL (GLOVE) ×3 IMPLANT
GOWN STRL REUS W/TWL LRG LVL3 (GOWN DISPOSABLE) ×3 IMPLANT
GOWN STRL REUS W/TWL XL LVL3 (GOWN DISPOSABLE) ×3 IMPLANT
GRID BRACH TEMP 18GA 2.8X3X.75 (MISCELLANEOUS) ×3 IMPLANT
GUIDEWIRE STR DUAL SENSOR (WIRE) IMPLANT
HOLDER FOLEY CATH W/STRAP (MISCELLANEOUS) ×3 IMPLANT
I-125 seeds IMPLANT
IMPL SPACEOAR VUE SYSTEM (Spacer) ×3 IMPLANT
IMPLANT SPACEOAR VUE SYSTEM (Spacer) ×2 IMPLANT
IV NS 1000ML (IV SOLUTION) ×2
IV NS 1000ML BAXH (IV SOLUTION) ×6 IMPLANT
KIT TURNOVER CYSTO (KITS) ×3 IMPLANT
MANIFOLD NEPTUNE II (INSTRUMENTS) IMPLANT
NDL BRACHY 18G 5PK (NEEDLE) ×12 IMPLANT
NDL BRACHY 18G SINGLE (NEEDLE) IMPLANT
NDL PK MORGANSTERN STABILIZ (NEEDLE) ×3 IMPLANT
NEEDLE BRACHY 18G 5PK (NEEDLE) ×8
NEEDLE BRACHY 18G SINGLE (NEEDLE)
NEEDLE PK MORGANSTERN STABILIZ (NEEDLE) ×2
NS IRRIG 500ML POUR BTL (IV SOLUTION) IMPLANT
PACK CYSTO (CUSTOM PROCEDURE TRAY) ×3 IMPLANT
SHEATH ULTRASOUND LF (SHEATH) IMPLANT
SHEATH ULTRASOUND LTX NONSTRL (SHEATH) IMPLANT
SLEEVE SCD COMPRESS KNEE MED (STOCKING) ×3 IMPLANT
SUT BONE WAX W31G (SUTURE) IMPLANT
SYR 10ML LL (SYRINGE) ×3 IMPLANT
TOWEL OR 17X24 6PK STRL BLUE (TOWEL DISPOSABLE) ×3 IMPLANT
TUBE CONNECTING 12X1/4 (SUCTIONS) IMPLANT
UNDERPAD 30X36 HEAVY ABSORB (UNDERPADS AND DIAPERS) ×6 IMPLANT
WATER STERILE IRR 3000ML UROMA (IV SOLUTION) ×3 IMPLANT
WATER STERILE IRR 500ML POUR (IV SOLUTION) ×3 IMPLANT

## 2023-04-27 NOTE — H&P (Signed)
 7.3.2023: TRUS/Bx. PSA 4.1, prostate volume 17.6 mL. PSAD 0.023. 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  Shim score 2   He decided to proceed w/ AS.   11.6.2023: PSA 4.94.   5.6.2024: Here for surveillance TRUS/Bx   04/04/2023:  Patient presents with his daughter at his side for preoperative clearance on upcoming brachytherapy and SpaceOAR scheduled for 8/8. He continues on sildenafil as needed. Since last seen, patient has had no changes to overall health nor baseline voiding function. He has no concerns, and few questions about upcoming procedure. He states that he is well-informed on what to expect. Today, patient denies irritative symptoms, gross hematuria, flank pain, fever/chills, nausea/vomiting.     ALLERGIES: No Known Allergies    MEDICATIONS: Levofloxacin 750 mg tablet 1 tablet PO Morning of procedure  Viagra 100 mg tablet 1/2 to 1 tab po as needed  Cetirizine Hcl 10 mg tablet 1 tablet PO Daily  Endocet 10 mg-325 mg tablet 1 tablet PO PRN  Gabapentin 300 mg capsule 1 capsule PO Daily  Glipizide 5 mg tablet 1 tablet PO Daily  Lisinopril-Hydrochlorothiazide 20 mg-25 mg tablet 1 tablet PO Daily  Meloxicam 7.5 mg tablet 1 tablet PO Daily  Pravastatin Sodium 20 mg tablet 1 tablet PO Daily     GU PSH: Prostate Needle Biopsy - 01/17/2023, 03/15/2022, 10/26/2021     NON-GU PSH: Surgical Pathology, Gross And Microscopic Examination For Prostate Needle - 01/17/2023, 03/15/2022, 10/26/2021     GU PMH: Prostate Cancer - 01/17/2023, Low volume grade group 1 prostate cancer on active surveillance. PSA up slightly as from his prebiopsy PSA, - 07/19/2022, Low volume grade group 1 prostate cancer. Nomogram predictions- Organ confined disease--83% Lymph node/seminal vesicle involvement--1% each 5/10-year progression free probability with  prostatectomy-95/91%, - 03/29/2022 Elevated PSA - 03/15/2022, - 10/26/2021, Small prostate, no nodularity. However, I feel that he has a high PSA density and would recommend ultrasound and biopsy, - 08/24/2021 ED due to arterial insufficiency - 08/24/2021    NON-GU PMH: None   Immunizations: None   FAMILY HISTORY: None   SOCIAL HISTORY: Marital Status: Single    VITAL SIGNS: None   MULTI-SYSTEM PHYSICAL EXAMINATION:    Constitutional: Well-nourished. No physical deformities. Normally developed. Good grooming.  Respiratory: No labored breathing, no use of accessory muscles. Lungs clear to auscultation bilaterally. No rales, wheezing.  Cardiovascular: Normal temperature, normal extremity pulses. Regular rate and rhythm. No murmurs, gallops.  Lymphatic: No enlargement of neck, axillae, groin.  Neurologic / Psychiatric: Oriented to time, oriented to place, oriented to person. No depression, no anxiety, no agitation.  Gastrointestinal: No mass, no tenderness, no rigidity, non obese abdomen.     Complexity of Data:  Source Of History:  Patient, Family/Caregiver, Medical Record Summary  Records Review:   Previous Doctor Records, Previous Patient Records  Urine Test Review:   Urinalysis   01/17/23 07/12/22 03/15/22 06/15/21 02/16/21 01/21/20  PSA  Total PSA 6.01 ng/mL 4.94 ng/mL 4.10 ng/mL 4.2 ng/ml 4.2 ng/ml 2.4 ng/ml  Free PSA   0.46 ng/mL     % Free PSA   11 % PSA       04/04/23  Urinalysis  Urine Appearance Clear   Urine Color Straw   Urine Glucose Neg mg/dL  Urine Bilirubin Neg mg/dL  Urine Ketones Neg  mg/dL  Urine Specific Gravity 1.020   Urine Blood Neg ery/uL  Urine pH 5.5   Urine Protein Neg mg/dL  Urine Urobilinogen 0.2 mg/dL  Urine Nitrites Neg   Urine Leukocyte Esterase Trace leu/uL  Urine WBC/hpf 6 - 10/hpf   Urine RBC/hpf NS (Not Seen)   Urine Epithelial Cells NS (Not Seen)   Urine Bacteria Rare (0-9/hpf)   Urine Mucous Not Present   Urine Yeast NS (Not Seen)    Urine Trichomonas Not Present   Urine Cystals NS (Not Seen)   Urine Casts NS (Not Seen)   Urine Sperm Not Present    PROCEDURES:          Urinalysis w/Scope Dipstick Dipstick Cont'd Micro  Color: Straw Bilirubin: Neg mg/dL WBC/hpf: 6 - 16/XWR  Appearance: Clear Ketones: Neg mg/dL RBC/hpf: NS (Not Seen)  Specific Gravity: 1.020 Blood: Neg ery/uL Bacteria: Rare (0-9/hpf)  pH: 5.5 Protein: Neg mg/dL Cystals: NS (Not Seen)  Glucose: Neg mg/dL Urobilinogen: 0.2 mg/dL Casts: NS (Not Seen)    Nitrites: Neg Trichomonas: Not Present    Leukocyte Esterase: Trace leu/uL Mucous: Not Present      Epithelial Cells: NS (Not Seen)      Yeast: NS (Not Seen)      Sperm: Not Present    ASSESSMENT:      ICD-10 Details  1 GU:   Prostate Cancer - C61 Chronic, Worsening  2   BPH w/LUTS - N40.1 Chronic, Stable   PLAN:           Orders Labs Urine Culture          Schedule Return Visit/Planned Activity: Keep Scheduled Appointment             Note: Brachytherapy and SpaceOAR on 8/8          Document Letter(s):  Created for Patient: Clinical Summary         Notes:   To date, UA with leukocytosis, rare bacteria. We will send for presurgical clearance and follow-up with patient as appropriate. We will withhold empiric therapy at this time.   We reviewed with patient and his daughter specifics of surgery and of the day. We answered all the questions to the best of our abilities. Patient was well-informed on procedure, and had few questions regarding it. He did inquire about a work excuse note. We advised that we are happy to provide an excuse from work postoperatively, if needed.   Maintain upcoming procedure for brachytherapy and SpaceOAR on 8/8. Patient knows to inform this office of any interim changes to general health or baseline voiding function. For now, continue on meloxicam, sildenafil as needed. Patient and his daughter both voiced understanding and are amenable to this plan.

## 2023-04-27 NOTE — Anesthesia Postprocedure Evaluation (Signed)
Anesthesia Post Note  Patient: Colton Garcia  Procedure(s) Performed: RADIOACTIVE SEED IMPLANT/BRACHYTHERAPY IMPLANT (Prostate) SPACE OAR INSTILLATION (Prostate) CYSTOSCOPY FLEXIBLE (Bladder)     Patient location during evaluation: PACU Anesthesia Type: General Level of consciousness: awake and alert Pain management: pain level controlled Vital Signs Assessment: post-procedure vital signs reviewed and stable Respiratory status: spontaneous breathing, nonlabored ventilation, respiratory function stable and patient connected to nasal cannula oxygen Cardiovascular status: blood pressure returned to baseline and stable Postop Assessment: no apparent nausea or vomiting Anesthetic complications: no  No notable events documented.  Last Vitals:  Vitals:   04/27/23 1315 04/27/23 1330  BP: (!) 153/77   Pulse: (!) 58 (!) 45  Resp: 12 13  Temp:    SpO2: 98% 99%    Last Pain:  Vitals:   04/27/23 1315  TempSrc:   PainSc: 0-No pain                 , S

## 2023-04-27 NOTE — Discharge Instructions (Addendum)
DISCHARGE INSTRUCTIONS FOR PROSTATE SEED IMPLANTATION  Removal of catheter Remove the foley catheter after 24 hours ( day after the procedure).can be done easily by cutting the side port of the catheter, whichallow the balloon to deflate.  You will see 1-2 teaspoons of clear water as the balloon deflates and then the catheter can be slid out without difficulty.  Antibiotics You may be given a prescription for an antibiotic to take when you arrive home. If so, be sure to take every tablet in the bottle, even if you are feeling better before the prescription is finished. If you begin itching, notice a rash or start to swell on your trunk, arms, legs and/or throat, immediately stop taking the antibiotic and call your Urologist.  Diet Resume your usual diet when you return home. To keep your bowels moving easily and softly, drink prune, apple and cranberry juice at room temperature. You may also take a stool softener, such as Colace, which is available without prescription at local pharmacies.  Daily activities No driving or heavy lifting for at least two days after the implant. No bike riding, horseback riding or riding lawn mowers for the first month after the implant. Any strenuous physical activity should be approved by your doctor before you resume it.  Sexual relations You may resume sexual relations two weeks after the procedure. A condom should be used for the first two weeks. Your semen may be dark brown or black; this is normal and is related bleeding that may have occurred during the implant.  Postoperative swelling Expect swelling and bruising of the scrotum and perineum (the area between the scrotum and anus). Both the swelling and the bruising should resolve in l or 2 weeks. Ice packs and over- the-counter medications such as Tylenol, Advil or Aleve may lessen your discomfort. Postoperative urination Most men experience burning on urination and/or urinary frequency. If this becomes  bothersome, contact your Urologist.  Medication can be prescribed to relieve these problems.  It is normal to have some blood in your urine for a few days after the implant. Special instructions related to the seeds It is unlikely that you will pass an Iodine-125 seed in your urine. The seeds are silver in color and are about as large as a grain of rice. If you pass a seed, do not handle it with your fingers. Use a spoon to place it in an envelope or jar in place this in base occluded area such as the garage or basement for return to the radiation clinic at your convenience.  Contact your doctor for Temperature greater than 101 F Increasing pain Inability to urinate  Follow-up  You should have follow up with your urologist and radiation oncologist about 3 weeks after the procedure.  General information regarding Iodine seeds Iodine-125 is a low energy radioactive material. It is not deeply penetrating and loses energy at short distances. Your prostate will absorb the radiation. Objects that are touched or used by the patient do not become radioactive. Body wastes (urine and stool) or body fluids (saliva, tears, semen or blood) are not radioactive. The Nuclear Regulatory Commission [facility]) has determined that no radiation precautions are needed for patients undergoing Iodine-125 seed implantation. The [facility] states that such patients do not present a risk to the people around them, including young children and pregnant women. However, in keeping with the general principle that radiation exposure should be kept as low reasonably possible, we suggest the following: Children and pets should not sit on  the patient's lap for the first two (2) weeks after the implant. Pregnant (or possibly pregnant) women should avoid prolonged, close contact with the patient for the first two (2) weeks after the implant. A distance of three (3) feet is acceptable. At a distance of three (3) feet, there is no limit to the  length of time anyone can be with the patient.  PROSTATE CANCER TREATMENT WITH RADIOACTIVE IODINE-125 SEED IMPLANT  This instruction sheet is intended to discuss implantation of Iodine-125 seeds as treatment for cancer of the prostate. It will explain in detail what you may expect from this treatment and what precautions are necessary as a result of the treatment. Iodine-125 emits a relatively low energy radiation. The radioactive seeds are surgically implanted directly into the prostate gland. Most of the radiation is contained within the prostate gland. A very small amount is present outside the body.The precautions that we ask you to take are to ensure that those around you are protected from unnecessary radiation. The principles of radiation safety that you need to understand are:  DISTANCE: The further a person is from the radioactive implant the less radiation they will be receiving. The amount of radiation received falls off quite rapidly with distance. More specific guidelines are given in the table on the last page.  TIME: The amount of radiation a person is exposed to is directly proportional to the amount of time that is spent in close proximity to the radioactive implant. Very little radiation will be received during short periods. See the table on the last page for more specific guideline.  CHILDREN UNDER AGE 98 Children should not be allowed to sit on your lap or otherwise be in very close contact for more than a few minutes for the first 6-8 weeks following the implant. You may affectionately greet (hug/kiss) a child for a short period of time, but remember, the longer you are in close proximity with that child the more radiation they are being exposed to. At a distance of 6 feet there is no limit to the length of time you may spend together. See specific guidelines on the last page.  PREGNANT OR POSSIBLY PREGNANT WOMEN Pregnant women should avoid prolonged close physical contact with  you for the first 6-8 weeks after implant. At a distance of 6 feet there is no limit to the length of time you may spend together. Pregnant women or possibly pregnant women can safely be in close contact with you for a limited period of time. See the last page for guidelines.  FAMILY RELATIONS You may sleep in the same bed as your partner (provided she is not pregnant or under the age of 56). Sexual intercourse, using a condom, may be resumed 2 weeks after the implant. Your semen may be discolored, dark brown or black. This is normal and is the result of bleeding that may have occurred during the implant. After 3-4 weeks it will not be necessary to use a condom.  DAILY ACTIVITIES You may resume normal activities in a few days (example: work, shopping, church) without the risk of harmful radiation exposure to those around you provided you keep in mind the time and distance precautions. Objects that you touch or item that you use do not become radioactive. Linens, clothing, tableware, and dishes may be used by other persons without special precautions. Your bodily wastes (urine and stool) are not radioactive.  SPECIAL PRECAUTIONS It is possible to lose implanted Iodine-125 seed(s) through urination. Although it is  possible to pass seeds indefinitely, it is most likely to occur immediately after catheter removal. To prevent this from happening the catheter that was in place during the implant procedure is removed immediately after the implant and a cystoscopy procedure is performed. The process of removing the catheter and the cystoscopy procedure should dislodge and remove any seeds that are not firmly imbedded in the prostate tissue. However, you should watch for seeds if/when you remove your catheter at home. The seeds are silver colored and the size of a grain of rice. In the unlikely event that a seed is seen after urination, simply flush the seed down the toilet. The seed should not be handled with  your fingers, not even with a glove or napkin. A spoon or tweezers can be used to pick up a seed. The Radiation Oncology department is open Monday - Friday from 8:00 am to 5:30 pm with a Radiation Oncologist on call at all times. He or she may be reached by calling [phone]. If you are to be hospitalized or if death should occur, your family should notify the Actor.  SIDE EFFECTS There are very few side effects associate with the implant procedure. Minor burning with urination, weak stream, hesitancy, intermittency, frequency, mild pain or feeling unable to pass your urine freely are common and usually stop in one to four months. If these symptoms are extremely uncomfortable, contact your physician.  RADIATION SAFETY GUIDELINES PROSTATE CANCER TREATMENT WITH RADIOACTIVE IODINE-125 SEED IMPLANT  The following guidelines will limit exposure to less than naturally occurring background radiation.  PERSONS AGE 9-45 (if able to become pregnant)  FOR 8 WEEKS FOLLOWING IMPLANT  At a distance of 1 foot: limit time to less than 2 hours/week At a distance of 3 feet: limit time to 20 hours/week At a distance of 6 feet: no restrictions  AFTER 8 WEEKS No restrictions  CHILDREN UNDER AGE 9, PREGNANT WOMEN OR POSSIBLY PREGNANT WOMEN  FOR 8 WEEKS FOLLOWING IMPLANT At a distance of 1 foot: limit time to 10 minutes/week At a distance of 3 feet: limit time to 2 hours/week At a distance of 6 feet: no restrictions  AFTER 8 WEEKS No restrictions  PERSONS OVER THE AGE OF 45 AND DO NOT EXPECT TO HAVE ANY MORE CHILDREN No restrictions  Updated by SCP in January 2020   Post Anesthesia Home Care Instructions  Activity: Get plenty of rest for the remainder of the day. A responsible individual must stay with you for 24 hours following the procedure.  For the next 24 hours, DO NOT: -Drive a car -Advertising copywriter -Drink alcoholic beverages -Take any medication unless  instructed by your physician -Make any legal decisions or sign important papers.  Meals: Start with liquid foods such as gelatin or soup. Progress to regular foods as tolerated. Avoid greasy, spicy, heavy foods. If nausea and/or vomiting occur, drink only clear liquids until the nausea and/or vomiting subsides. Call your physician if vomiting continues.  Special Instructions/Symptoms: Your throat may feel dry or sore from the anesthesia or the breathing tube placed in your throat during surgery. If this causes discomfort, gargle with warm salt water. The discomfort should disappear within 24 hours.

## 2023-04-27 NOTE — Transfer of Care (Signed)
Immediate Anesthesia Transfer of Care Note  Patient: Colton Garcia  Procedure(s) Performed: RADIOACTIVE SEED IMPLANT/BRACHYTHERAPY IMPLANT (Prostate) SPACE OAR INSTILLATION (Prostate) CYSTOSCOPY FLEXIBLE (Bladder)  Patient Location: PACU  Anesthesia Type:General  Level of Consciousness: awake, alert , and oriented  Airway & Oxygen Therapy: Patient Spontanous Breathing  Post-op Assessment: Report given to RN and Post -op Vital signs reviewed and stable  Post vital signs: Reviewed and stable  Last Vitals:  Vitals Value Taken Time  BP 146/78 04/27/23 1254  Temp 36.3 C 04/27/23 1254  Pulse 58 04/27/23 1255  Resp 13 04/27/23 1255  SpO2 100 % 04/27/23 1255  Vitals shown include unfiled device data.  Last Pain:  Vitals:   04/27/23 0931  TempSrc: Oral  PainSc: 3       Patients Stated Pain Goal: 3 (04/27/23 0931)  Complications: No notable events documented.

## 2023-04-27 NOTE — Op Note (Signed)
Preoperative diagnosis:  Clinical stage TI C adenocarcinoma the prostate  Postoperative diagnosis:  Same  Procedure:  #1 I-125 prostate seed implantation  #2 cystourethroscopy #3 instillation of SpaceOAR biogel  Surgeon: Berniece Salines, M.D. Radiation Oncologist: Margaretmary Dys, M.D.  Anesthesia: Gen.   Indications: Patient  was diagnosed with clinical stage TI C prostate cancer. We had extensive discussion with him about treatment options versus. He elected to proceed with seed implantation. He underwent consultation my office as well as with Dr. Margaretmary Dys. He appeared to understand the advantages disadvantages potential risks of this treatment option. Full informed consent has been obtained. The patient is had preoperative ciprofloxacin. PAS compression boots were placed.   Technique and findings: Patient was brought the operating room where he had  successful induction of general anesthesia. He was placed in lithotomy position and prepped and draped in usual manner. Appropriate surgical timeout was performed. Radiation oncology department placed a transrectal ultrasound probe anchoring stand. Foley catheter with contrast in the balloon was inserted without difficulty. Anchoring needles were placed within the prostate.  Real-time contouring of the urethra prostate and rectum were performed and the dosing parameters were established. Targeted dose was 145 gray. We then came to the operating suite suite for placement of the needles. A second timeout was performed. All needle passage was done with real-time transrectal ultrasound guidance with the sagittal plane. A total of 20 needles were placed.  64 active seeds were implanted.  The brachytherapy template was then removed.   A site in the midline was selected on the perineum for placement of an 18 g needle with saline.  The needle was advanced above the rectum and below Denonvillier's fascia to the mid gland and confirmed to be in the midline  on transverse imaging.  One cc of saline was injected confirming appropriate expansion of this space.  A total of 5 cc of saline was then injected to open the space further bilaterally.  The saline syringe was then removed and the SpaceOAR hydrogel was injected with good distribution bilaterally.A Foley catheter was removed and flexible cystoscopy failed to show any seeds outside the prostate.  The Foley catheter was inserted which drained clear urine. The patient was brought to recovery room in stable condition.

## 2023-04-27 NOTE — Progress Notes (Signed)
Radiation Oncology         [phone] ________________________________  Name: CHONG MAUSOLF MRN: [id number]  Date: 04/27/2023  DOB: March 27, 1951       Prostate Seed Implant  QI:[id number], Adult And Pediatric  No ref. provider found  DIAGNOSIS:  72 y.o. gentleman with Stage T1c adenocarcinoma of the prostate with Gleason score of 3+4, and PSA of 6.   PRE-OP: This procedure was originally scheduled for August 8.  It was canceled at that time due to a power outage at the hospital.  Due to the decaying radioactivity of the radioactive I-125 seeds, the patient was rescheduled rapidly for today.  Due to physician availability, I will be performing placement of the needles and SpaceOAR in the event that Dr. Marlou Porch is unavailable during these portions of the procedure.  I discussed this with the patient and his family during the consent process in the preop area and documented hip on the consent form.  Oncology History  Malignant neoplasm of prostate (HCC)  01/17/2023 Cancer Staging   Staging form: Prostate, AJCC 8th Edition - Clinical stage from 01/17/2023: Stage IIB (cT1c, cN0, cM0, PSA: 6.1, Grade Group: 2) - Signed by Marcello Fennel, PA-C on 01/27/2023 Histopathologic type: Adenocarcinoma, NOS Stage prefix: Initial diagnosis Prostate specific antigen (PSA) range: Less than 10 Gleason primary pattern: 3 Gleason secondary pattern: 4 Gleason score: 7 Histologic grading system: 5 grade system Number of biopsy cores examined: 12 Number of biopsy cores positive: 3 Location of positive needle core biopsies: Both sides   01/27/2023 Initial Diagnosis   Malignant neoplasm of prostate (HCC)       ICD-10-CM   1. Pre-op testing  Z01.818 CBG per Guidelines for Diabetes Management for Patients Undergoing Surgery ([facility], [facility], and [facility] only)    CBG per protocol    CBG per Guidelines for Diabetes Management for Patients Undergoing Surgery ([facility], [facility], and [facility] only)    CBG per protocol      PROCEDURE:  Insertion of radioactive I-125 seeds into the prostate gland.  RADIATION DOSE: 145 Gy, definitive therapy.  TECHNIQUE: NIHAN HOLZKNECHT was brought to the operating room with the urologist. He was placed in the dorsolithotomy position. He was catheterized and a rectal tube was inserted. The perineum was shaved, prepped and draped. The ultrasound probe was then introduced by me into the rectum to see the prostate gland.  TREATMENT DEVICE: I attached the needle grid to the ultrasound probe stand and anchor needles were placed.  3D PLANNING: The prostate was imaged in 3D using a sagittal sweep of the prostate probe. These images were transferred to the planning computer. There, the prostate, urethra and rectum were defined on each axial reconstructed image. Then, the software created an optimized 3D plan and a few seed positions were adjusted. The quality of the plan was reviewed using [facility] information for the target and the following two organs at risk:  Urethra and Rectum.  Then the accepted plan was printed and handed off to the radiation therapist.  Under my supervision, the custom loading of the seeds and spacers was carried out using the quick loader.  These pre-loaded needles were then placed into the needle holder.Marland Kitchen  PROSTATE VOLUME STUDY:  Using transrectal ultrasound the volume of the prostate was verified to be *** cc.  SPECIAL TREATMENT PROCEDURE/SUPERVISION AND HANDLING: The pre-loaded needles were then delivered by the urologist under sagittal guidance. A total of *** needles were used to deposit *** seeds in the prostate gland.  The individual seed activity was *** mCi.  SpaceOAR:  ***  COMPLEX SIMULATION: At the end of the procedure, an anterior radiograph of the pelvis was obtained to document seed positioning and count. Cystoscopy was performed by the urologist to check the urethra and bladder.  MICRODOSIMETRY: At the end of the procedure, the patient was emitting *** mR/hr at 1 meter.  Accordingly, he was considered safe for hospital discharge.  PLAN: The patient will return to the radiation oncology clinic for post implant CT dosimetry in three weeks.   ________________________________  Artist Pais Kathrynn Running, M.D.

## 2023-04-27 NOTE — Interval H&P Note (Signed)
History and Physical Interval Note:  04/27/2023 10:00 AM  Colton Garcia  has presented today for surgery, with the diagnosis of PROSTATE CANCER.  The various methods of treatment have been discussed with the patient and family. After consideration of risks, benefits and other options for treatment, the patient has consented to  Procedure(s): RADIOACTIVE SEED IMPLANT/BRACHYTHERAPY IMPLANT (N/A) SPACE OAR INSTILLATION (N/A) CYSTOSCOPY FLEXIBLE (N/A) as a surgical intervention.  The patient's history has been reviewed, patient examined, no change in status, stable for surgery.  I have reviewed the patient's chart and labs.  Questions were answered to the patient's satisfaction.     Crist Fat

## 2023-04-27 NOTE — Anesthesia Procedure Notes (Signed)
Procedure Name: Intubation Date/Time: 04/27/2023 11:18 AM  Performed by: Dairl Ponder, CRNAPre-anesthesia Checklist: Patient identified, Emergency Drugs available, Suction available and Patient being monitored Patient Re-evaluated:Patient Re-evaluated prior to induction Oxygen Delivery Method: Circle System Utilized Preoxygenation: Pre-oxygenation with 100% oxygen Induction Type: IV induction Ventilation: Mask ventilation without difficulty Laryngoscope Size: Mac and 4 Grade View: Grade I Tube type: Oral Tube size: 7.5 mm Number of attempts: 1 Airway Equipment and Method: Stylet and Oral airway Placement Confirmation: ETT inserted through vocal cords under direct vision, positive ETCO2 and breath sounds checked- equal and bilateral Secured at: 23 cm Tube secured with: Tape Dental Injury: Teeth and Oropharynx as per pre-operative assessment

## 2023-04-27 NOTE — Anesthesia Preprocedure Evaluation (Signed)
Anesthesia Evaluation  Patient identified by MRN, date of birth, ID band Patient awake    Reviewed: Allergy & Precautions, H&P , NPO status , Patient's Chart, lab work & pertinent test results  Airway Mallampati: II  TM Distance: >3 FB Neck ROM: Full    Dental no notable dental hx.    Pulmonary Current SmokerPatient did not abstain from smoking.   Pulmonary exam normal breath sounds clear to auscultation       Cardiovascular hypertension, + Peripheral Vascular Disease  Normal cardiovascular exam Rhythm:Regular Rate:Normal     Neuro/Psych negative neurological ROS  negative psych ROS   GI/Hepatic negative GI ROS, Neg liver ROS,,,  Endo/Other  diabetes, Type 2    Renal/GU Renal InsufficiencyRenal disease  negative genitourinary   Musculoskeletal negative musculoskeletal ROS (+)    Abdominal   Peds negative pediatric ROS (+)  Hematology negative hematology ROS (+)   Anesthesia Other Findings   Reproductive/Obstetrics negative OB ROS                             Anesthesia Physical Anesthesia Plan  ASA: 3  Anesthesia Plan: General   Post-op Pain Management: Minimal or no pain anticipated   Induction: Intravenous  PONV Risk Score and Plan: 1 and Ondansetron, Dexamethasone and Treatment may vary due to age or medical condition  Airway Management Planned: LMA  Additional Equipment:   Intra-op Plan:   Post-operative Plan: Extubation in OR  Informed Consent: I have reviewed the patients History and Physical, chart, labs and discussed the procedure including the risks, benefits and alternatives for the proposed anesthesia with the patient or authorized representative who has indicated his/her understanding and acceptance.     Dental advisory given  Plan Discussed with: CRNA and Surgeon  Anesthesia Plan Comments:        Anesthesia Quick Evaluation

## 2023-04-28 ENCOUNTER — Telehealth: Payer: Self-pay

## 2023-04-28 ENCOUNTER — Telehealth: Payer: Self-pay | Admitting: *Deleted

## 2023-04-28 ENCOUNTER — Encounter (HOSPITAL_BASED_OUTPATIENT_CLINIC_OR_DEPARTMENT_OTHER): Payer: Self-pay | Admitting: Urology

## 2023-04-28 NOTE — Telephone Encounter (Signed)
RN returned patient call he had seed implant surgery yesterday 04/27/2023 with Dr. Marlou Porch for prostate cancer and called concerned about a little bleeding and some padding that he reports was stuck to his rear end.  Patient denies any other complaints fever, chills, nausea or vomiting.  He reports that after the procedure he was a little groggy and didn't really hear the discharge instructions that his daughter was present with him but he wanted to talk to medical professionals.  He was unable to describe the padding but said he thinks its partly in rectum.  RN advised him not to remove padding.  RN ask patient to call Dr. Jasmine Awe nurse number was given to patient to follow up what he sure do.

## 2023-04-28 NOTE — Telephone Encounter (Signed)
Returned patient's phone call, spoke with patient 

## 2023-04-29 ENCOUNTER — Telehealth: Payer: Self-pay | Admitting: *Deleted

## 2023-04-29 NOTE — Telephone Encounter (Signed)
Called patient to give new post seed appts. on 05-12-23, spoke with patient and he is aware of these appts.

## 2023-04-29 NOTE — Progress Notes (Signed)
RN spoke with patient and provided education on post brachytherapy side effects.  Pt verbalized understanding.  Patient has conflict with upcoming CT Sim on 8/28, request placed to have this rescheduled.  Education provided on post treatment PSA monitoring.  No additional needs at this time.

## 2023-04-29 NOTE — Telephone Encounter (Signed)
xxxx 

## 2023-05-09 ENCOUNTER — Telehealth: Payer: Self-pay

## 2023-05-09 ENCOUNTER — Other Ambulatory Visit: Payer: Self-pay | Admitting: Radiology

## 2023-05-09 DIAGNOSIS — R112 Nausea with vomiting, unspecified: Secondary | ICD-10-CM

## 2023-05-09 MED ORDER — ONDANSETRON HCL 8 MG PO TABS
8.0000 mg | ORAL_TABLET | Freq: Three times a day (TID) | ORAL | 0 refills | Status: AC | PRN
Start: 2023-05-09 — End: 2023-05-16

## 2023-05-09 NOTE — Telephone Encounter (Signed)
Mr. Colton Garcia left message over weekend with complaints nausea/vomiting, hot flashes and chills. Reports symptoms started on 05/05/2023. Mr. Colton Garcia denies being around anyone with COVID, denies having fever, and no diarrhea. He's a prostate patient and had seed implant on 04/27/2023. He's been advised to eat light and drink fluids to stay hydrated such broth, jello, Gatorade/powerade and water.  Mr. Colton Garcia said he's been doing his best to stay hydrated and eat but just not able to keep it down for long.  RN will reach out to Bryan Lemma, PA-C for medication prescription to help with the nausea/vomiting requested it be sent to his  pharmacy [facility] on [address] [facility].   RN expressed to patient in mean time if symptoms get worse to go to ED for further treatment and evaluation.  Mr. Colton Garcia verbalized understanding and agreed.

## 2023-05-09 NOTE — Telephone Encounter (Signed)
RN returned call to Mr. Colton Garcia to inform him of his Zofran prescription for nausea/vomiting he can pick up later today.  Mr. Colton Garcia was appreciative of the quick response he will have his daughter pick up prescription today.

## 2023-05-10 ENCOUNTER — Telehealth: Payer: Self-pay | Admitting: *Deleted

## 2023-05-10 NOTE — Telephone Encounter (Signed)
Called patient to remind of post seed appts. for 05-12-23, spoke with patient and he is aware of these appts.

## 2023-05-11 ENCOUNTER — Ambulatory Visit: Payer: Medicare Other | Admitting: Radiation Oncology

## 2023-05-11 ENCOUNTER — Ambulatory Visit: Payer: Self-pay | Admitting: Urology

## 2023-05-11 NOTE — Progress Notes (Signed)
  Radiation Oncology         [phone]) [phone] ________________________________  Name: Colton Garcia MRN: [id number]  Date: 05/12/2023  DOB: 08-15-1951  COMPLEX SIMULATION NOTE  NARRATIVE:  The patient was brought to the CT Simulation planning suite today following prostate seed implantation approximately one month ago.  Identity was confirmed.  All relevant records and images related to the planned course of therapy were reviewed.  Then, the patient was set-up supine.  CT images were obtained.  The CT images were loaded into the planning software.  Then the prostate and rectum were contoured.  Treatment planning then occurred.  The implanted iodine 125 seeds were identified by the physics staff for projection of radiation distribution  I have requested : 3D Simulation  I have requested a DVH of the following structures: Prostate and rectum.    ________________________________  Artist Pais Kathrynn Running, M.D.

## 2023-05-11 NOTE — Progress Notes (Signed)
Radiation Oncology         [phone]) [phone] ________________________________  Name: Colton Garcia MRN: [id number]  Date: 05/12/2023  DOB: Oct 01, 1950  Post-Seed Follow-Up Visit Note  CC: [facility], Adult [facility] Pediatric  Marcine Matar, MD  Diagnosis:   72 y.o. gentleman with Stage T1c adenocarcinoma of the prostate with Gleason score of 3+4, and PSA of 6.     ICD-10-CM   1. Malignant neoplasm of prostate (HCC)  C61       Interval Since Last Radiation:  2 weeks 04/27/23:  Insertion of radioactive I-125 seeds into the prostate gland; 145 Gy, definitive therapy with placement of SpaceOAR gel.  Narrative:  The patient returns today for routine follow-up.  He is complaining of increased urinary frequency and urinary hesitation symptoms. He filled out a questionnaire regarding urinary function today providing and overall IPSS score of 8 characterizing his symptoms as mild-moderate with nocturia x4 and daytime frequency which are manageable with taking the Flomax daily.  His pre-implant score was 16.  He specifically denies dysuria, gross hematuria, fever, chills or night sweats.  He does report some mild rectal fullness/discomfort but denies any abdominal pain or bowel symptoms.  He has noticed that his stamina has been down a bit but he has been able to remain active and is anxious to return to his job.  Overall, he is quite pleased with his progress to date.  ALLERGIES:  has No Known Allergies.  Meds: Current Outpatient Medications  Medication Sig Dispense Refill   amLODipine (NORVASC) 10 MG tablet Take 10 mg by mouth at bedtime.      cetirizine (ZYRTEC) 10 MG tablet Take 10 mg by mouth at bedtime.     Elderberry-Vitamin C-Zinc (ELDERBERRY IMMUNE HEALTH GUMMY PO) Take by mouth.     gabapentin (NEURONTIN) 300 MG capsule Take 300 mg by mouth 2 (two) times daily.     glipiZIDE (GLUCOTROL) 5 MG tablet Take 5 mg by mouth 2 (two) times daily.     Lancets (ONETOUCH DELICA PLUS LANCET30G) MISC  USE AS DIRECTED CHECK BLOOD SUGAR ONCE TO TWICE DAILY     lisinopril-hydrochlorothiazide (ZESTORETIC) 20-25 MG tablet Take 1 tablet by mouth at bedtime.     ondansetron (ZOFRAN) 8 MG tablet Take 1 tablet (8 mg total) by mouth every 8 (eight) hours as needed for up to 7 days for nausea or vomiting. 20 tablet 0   ONETOUCH VERIO test strip daily.     phenazopyridine (PYRIDIUM) 200 MG tablet Take 1 tablet (200 mg total) by mouth 3 (three) times daily as needed for pain. 10 tablet 0   pravastatin (PRAVACHOL) 20 MG tablet Take 20 mg by mouth at bedtime.     sildenafil (VIAGRA) 100 MG tablet TAKE 1/2 TO 1 (ONE-HALF TO ONE) TABLET BY MOUTH AS NEEDED (Patient not taking: Reported on 04/04/2023)     tamsulosin (FLOMAX) 0.4 MG CAPS capsule Take 1 capsule (0.4 mg total) by mouth daily. 30 capsule 5   traMADol (ULTRAM) 50 MG tablet Take 1-2 tablets (50-100 mg total) by mouth every 6 (six) hours as needed for moderate pain. 15 tablet 0   No current facility-administered medications for this visit.    Physical Findings: In general this is a well appearing African-American male in no acute distress. He's alert and oriented x4 and appropriate throughout the examination. Cardiopulmonary assessment is negative for acute distress and he exhibits normal effort.   Lab Findings: Lab Results  Component Value Date   WBC 9.7  11/26/2017   HGB 15.3 04/21/2023   HCT 45.0 04/21/2023   MCV 89.5 11/26/2017   PLT 273 11/26/2017    Radiographic Findings:  Patient underwent CT imaging in our clinic for post implant dosimetry. The CT will be reviewed by Dr. Kathrynn Running to confirm there is an adequate distribution of radioactive seeds throughout the prostate gland and ensure that there are no seeds in or near the rectum.  We suspect the final radiation plan and dosimetry will show appropriate coverage of the prostate gland. He understands that we will call and inform him of any unexpected findings on further review of his imaging  and dosimetry.  Impression/Plan: 72 y.o. gentleman with Stage T1c adenocarcinoma of the prostate with Gleason score of 3+4, and PSA of 6.  The patient is recovering from the effects of radiation. His urinary symptoms should gradually improve over the next 4-6 months. We talked about this today and he will continue taking the Flomax daily as prescribed.  I will renew his tramadol prescription to help with the rectal discomfort.  He is encouraged by his improvement already and is otherwise pleased with his outcome. We also talked about long-term follow-up for prostate cancer following seed implant. He understands that ongoing PSA determinations and digital rectal exams will help perform surveillance to rule out disease recurrence. He has a follow up appointment scheduled with Buzzy Han, PA-C on 05/30/2023 and then we will see Dr. Marlou Porch in November 2024 for his initial posttreatment PSA. He understands what to expect with his PSA measures. Patient was also educated today about some of the long-term effects from radiation including a small risk for rectal bleeding and possibly erectile dysfunction. We talked about some of the general management approaches to these potential complications. However, I did encourage the patient to contact our office or return at any point if he has questions or concerns related to his previous radiation and prostate cancer.    Marguarite Arbour, PA-C

## 2023-05-12 ENCOUNTER — Ambulatory Visit
Admission: RE | Admit: 2023-05-12 | Discharge: 2023-05-12 | Disposition: A | Discharge status: Home / Self Care | Payer: Medicare Other | Source: Ambulatory Visit | Attending: Radiation Oncology | Admitting: Radiation Oncology

## 2023-05-12 ENCOUNTER — Ambulatory Visit
Admission: RE | Admit: 2023-05-12 | Discharge: 2023-05-12 | Disposition: A | Discharge status: Home / Self Care | Payer: Medicare Other | Source: Ambulatory Visit | Attending: Urology | Admitting: Urology

## 2023-05-12 ENCOUNTER — Encounter: Payer: Self-pay | Admitting: Urology

## 2023-05-12 VITALS — BP 107/61 | HR 58 | Temp 97.9°F | Resp 20 | Ht 71.0 in | Wt 156.8 lb

## 2023-05-12 DIAGNOSIS — C61 Malignant neoplasm of prostate: Secondary | ICD-10-CM

## 2023-05-12 MED ORDER — TRAMADOL HCL 50 MG PO TABS: 50.0000 mg | ORAL_TABLET | Freq: Four times a day (QID) | ORAL | 0 refills | Status: DC | PRN

## 2023-05-12 NOTE — Progress Notes (Signed)
Post-seed nursing interview for a diagnosis of Stage T1c adenocarcinoma of the prostate with Gleason score of 3+4, and PSA of 6.  Patient identity verified x2. Patient reports LT buttock pain 3/10, Tramadol helping. No related issues conveyed at this time.  Meaningful use complete.  I-PSS score- 8 - Moderate SHIM score- 1- No sexual activity  Urinary Management medication(s) Tamsulosin Urology appointment date- 05/30/2023 with Dr. Marlou Porch at [facility]  Vitals- BP 107/61 (BP Location: Right Arm, Patient Position: Sitting, Cuff Size: Normal)   Pulse (!) 58   Temp 97.9 F (36.6 C) (Temporal)   Resp 20   Ht 5\' 11"  (1.803 m)   Wt 156 lb 12.8 oz (71.1 kg)   SpO2 100%   BMI 21.87 kg/m    This concludes the interaction.  Ruel Favors, LPN

## 2023-05-20 ENCOUNTER — Encounter: Payer: Self-pay | Admitting: *Deleted

## 2023-05-20 NOTE — Progress Notes (Signed)
Called pt to set up SCP appt. When I asked the pt how how he was doing, he voiced that he has not felt well. Symptoms are fever, loss of appetite and tiredness. I asked him if he is voiding. Pt says yes. I then asked him if he thinks he may have COVID, he says no. Pt says he has felt like this since the procedure on Aug.14 when seeds were placed. I then called [facility] to get a message to Dr. Jasmine Awe nurse to make them aware. I am expecting a call back from [facility].

## 2023-05-23 ENCOUNTER — Ambulatory Visit
Admission: RE | Admit: 2023-05-23 | Discharge: 2023-05-23 | Disposition: A | Discharge status: Home / Self Care | Payer: Medicare Other | Source: Ambulatory Visit | Attending: Radiation Oncology | Admitting: Radiation Oncology

## 2023-05-23 ENCOUNTER — Encounter: Payer: Self-pay | Admitting: Radiation Oncology

## 2023-05-23 DIAGNOSIS — C61 Malignant neoplasm of prostate: Secondary | ICD-10-CM | POA: Insufficient documentation

## 2023-05-23 NOTE — Radiation Completion Notes (Addendum)
  Radiation Oncology         [phone] ________________________________  Name: Colton Garcia MRN: [id number]  Date: 05/12/2023  DOB: 1951-05-13  Referring Physician: GARNETTE SHACK, M.D. Date of Service: 2023-05-23 Radiation Oncologist: Adina Garcia, M.D. [facility]     RADIATION ONCOLOGY END OF TREATMENT NOTE     Diagnosis:  72 y.o. gentleman with Stage T1c adenocarcinoma of the prostate with Gleason score of 3+4, and PSA of 6.   Intent: Curative     ==========DELIVERED PLANS==========  Prostate Seed Implant Date: 2023-04-27   Plan Name: Prostate Seed Implant Site: Prostate Technique: Radioactive Seed Implant I-125 Mode: Brachytherapy Dose Per Fraction: 145 Gy Prescribed Dose (Delivered / Prescribed): 145 Gy / 145 Gy Prescribed Fxs (Delivered / Prescribed): 1 / 1     ==========ON TREATMENT VISIT DATES========== 2023-04-27     ------------------------------------------------   Colton Barge, MD [facility]  Radiation Oncology Direct Dial: [phone]  Fax: [phone] [email or web address]  Skype  LinkedIn

## 2023-05-25 NOTE — Progress Notes (Signed)
  Radiation Oncology         [phone]) [phone] ________________________________  Name: Colton Garcia MRN: [id number]  Date: 05/23/2023  DOB: 1950/12/25  3D Planning Note   Prostate Brachytherapy Post-Implant Dosimetry  Diagnosis: 72 y.o. gentleman with Stage T1c adenocarcinoma of the prostate with Gleason score of 3+4, and PSA of 6.   Narrative: On a previous date, Colton Garcia returned following prostate seed implantation for post implant planning. He underwent CT scan complex simulation to delineate the three-dimensional structures of the pelvis and demonstrate the radiation distribution.  Since that time, the seed localization, and complex isodose planning with dose volume histograms have now been completed.  Results:   Prostate Coverage - The dose of radiation delivered to the 90% or more of the prostate gland (D90) was 97.79% of the prescription dose. This exceeds our goal of greater than 90%. Rectal Sparing - The volume of rectal tissue receiving the prescription dose or higher was 0.0 cc. This falls under our thresholds tolerance of 1.0 cc.  Impression: The prostate seed implant appears to show adequate target coverage and appropriate rectal sparing.  Plan:  The patient will continue to follow with urology for ongoing PSA determinations. I would anticipate a high likelihood for local tumor control with minimal risk for rectal morbidity.  ________________________________  Artist Pais Kathrynn Running, M.D.

## 2023-05-26 ENCOUNTER — Encounter: Payer: Self-pay | Admitting: *Deleted

## 2023-06-02 ENCOUNTER — Encounter: Payer: Self-pay | Admitting: *Deleted

## 2023-06-06 ENCOUNTER — Other Ambulatory Visit: Payer: Self-pay

## 2023-06-06 ENCOUNTER — Ambulatory Visit (HOSPITAL_COMMUNITY): Payer: Medicare Other

## 2023-06-06 ENCOUNTER — Emergency Department (HOSPITAL_COMMUNITY)
Admission: EM | Admit: 2023-06-06 | Discharge: 2023-06-06 | Disposition: A | Discharge status: Home / Self Care | Payer: Medicare Other | Attending: Emergency Medicine | Admitting: Emergency Medicine

## 2023-06-06 ENCOUNTER — Ambulatory Visit: Payer: Medicare Other | Admitting: Surgery

## 2023-06-06 ENCOUNTER — Encounter (HOSPITAL_COMMUNITY): Payer: Self-pay

## 2023-06-06 DIAGNOSIS — Z8546 Personal history of malignant neoplasm of prostate: Secondary | ICD-10-CM | POA: Diagnosis not present

## 2023-06-06 DIAGNOSIS — R339 Retention of urine, unspecified: Secondary | ICD-10-CM | POA: Diagnosis present

## 2023-06-06 LAB — BASIC METABOLIC PANEL
Anion gap: 10 (ref 5–15)
BUN: 24 mg/dL — ABNORMAL HIGH (ref 8–23)
CO2: 26 mmol/L (ref 22–32)
Calcium: 9.2 mg/dL (ref 8.9–10.3)
Chloride: 90 mmol/L — ABNORMAL LOW (ref 98–111)
Creatinine, Ser: 1.19 mg/dL (ref 0.61–1.24)
GFR, Estimated: >60 mL/min (ref >60)
Glucose, Bld: 403 mg/dL — ABNORMAL HIGH (ref 70–99)
Potassium: 4.3 mmol/L (ref 3.5–5.1)
Sodium: 126 mmol/L — ABNORMAL LOW (ref 135–145)

## 2023-06-06 LAB — URINALYSIS, W/ REFLEX TO CULTURE (INFECTION SUSPECTED)
Bilirubin Urine: NEGATIVE
Glucose, UA: >=500 mg/dL — AB
Ketones, ur: NEGATIVE mg/dL
Nitrite: NEGATIVE
Protein, ur: 30 mg/dL — AB
Specific Gravity, Urine: 1.018 (ref 1.005–1.030)
WBC, UA: >50 WBC/hpf (ref 0–5)
pH: 5 (ref 5.0–8.0)

## 2023-06-06 LAB — CBC WITH DIFFERENTIAL/PLATELET
Abs Immature Granulocytes: 0.06 10*3/uL (ref 0.00–0.07)
Basophils Absolute: 0.1 10*3/uL (ref 0.0–0.1)
Basophils Relative: 1 %
Eosinophils Absolute: 0.1 10*3/uL (ref 0.0–0.5)
Eosinophils Relative: 1 %
HCT: 30.7 % — ABNORMAL LOW (ref 39.0–52.0)
Hemoglobin: 10.2 g/dL — ABNORMAL LOW (ref 13.0–17.0)
Immature Granulocytes: 1 %
Lymphocytes Relative: 14 %
Lymphs Abs: 1.5 10*3/uL (ref 0.7–4.0)
MCH: 27.6 pg (ref 26.0–34.0)
MCHC: 33.2 g/dL (ref 30.0–36.0)
MCV: 83 fL (ref 80.0–100.0)
Monocytes Absolute: 0.6 10*3/uL (ref 0.1–1.0)
Monocytes Relative: 5 %
Neutro Abs: 8.8 10*3/uL — ABNORMAL HIGH (ref 1.7–7.7)
Neutrophils Relative %: 78 %
Platelets: 707 10*3/uL — ABNORMAL HIGH (ref 150–400)
RBC: 3.7 MIL/uL — ABNORMAL LOW (ref 4.22–5.81)
RDW: 13.8 % (ref 11.5–15.5)
WBC: 11.1 10*3/uL — ABNORMAL HIGH (ref 4.0–10.5)
nRBC: 0 % (ref 0.0–0.2)

## 2023-06-06 MED ORDER — SODIUM CHLORIDE 0.9 % IV SOLN
1.0000 g | Freq: Once | INTRAVENOUS | Status: AC
  Administered 2023-06-06: 1 g via INTRAVENOUS
  Filled 2023-06-06: qty 10

## 2023-06-06 MED ORDER — OXYCODONE-ACETAMINOPHEN 5-325 MG PO TABS
1.0000 | ORAL_TABLET | Freq: Once | ORAL | Status: AC
  Administered 2023-06-06: 1 via ORAL
  Filled 2023-06-06: qty 1

## 2023-06-06 MED ORDER — LACTATED RINGERS IV BOLUS
1000.0000 mL | Freq: Once | INTRAVENOUS | Status: AC
  Administered 2023-06-06: 1000 mL via INTRAVENOUS

## 2023-06-06 MED ORDER — MORPHINE SULFATE (PF) 4 MG/ML IV SOLN
4.0000 mg | Freq: Once | INTRAVENOUS | Status: AC
  Administered 2023-06-06: 4 mg via INTRAVENOUS
  Filled 2023-06-06: qty 1

## 2023-06-06 MED ORDER — CEPHALEXIN 500 MG PO CAPS: 500.0000 mg | ORAL_CAPSULE | Freq: Four times a day (QID) | ORAL | 0 refills | Status: DC

## 2023-06-06 NOTE — ED Provider Notes (Signed)
[place] EMERGENCY DEPARTMENT AT [facility] Provider Note   CSN: [id number] Arrival date & time: 06/06/23  5284     History  Chief Complaint  Patient presents with   Urinary Retention    Pt arrived via EMS with c/o of urinary retention x 3 days. Complains of a 8/10 pain when urinating. Able to urinate some with great difficulty. Pt states that he recently has had a UTI and a history of prostate cancer. Complaints of a cold sweat and nausea but no fever.     Colton Garcia is a 72 y.o. male.  This is a 72 year old male with a history of prostate cancer who comes to the emergency department today due to pain with urination, difficulty urinating.  Patient had radiation seeding done on August 14 of this year.  He says that he has "not had a good day since."  Patient says that he has had pain with urination, and has had a more difficult time passing urine.          Home Medications Prior to Admission medications   Medication Sig Start Date End Date Taking? Authorizing Provider  cephALEXin (KEFLEX) 500 MG capsule Take 1 capsule (500 mg total) by mouth 4 (four) times daily. 06/06/23  Yes Anders Simmonds T, DO  amLODipine (NORVASC) 10 MG tablet Take 10 mg by mouth at bedtime.     [provider]  cetirizine (ZYRTEC) 10 MG tablet Take 10 mg by mouth at bedtime. 02/08/20   [provider]  Elderberry-Vitamin C-Zinc (ELDERBERRY IMMUNE HEALTH GUMMY PO) Take by mouth.    [provider]  gabapentin (NEURONTIN) 300 MG capsule Take 300 mg by mouth 2 (two) times daily. 04/09/22   [provider]  glipiZIDE (GLUCOTROL) 5 MG tablet Take 5 mg by mouth 2 (two) times daily. 02/08/20   [provider]  Lancets (ONETOUCH DELICA PLUS LANCET30G) MISC USE AS DIRECTED CHECK BLOOD SUGAR ONCE TO TWICE DAILY 10/11/22   [provider]  lisinopril-hydrochlorothiazide (ZESTORETIC) 20-25 MG tablet Take 1 tablet by mouth at bedtime. 02/26/22    [provider]  [facility] VERIO test strip daily. 10/11/22   [provider]  phenazopyridine (PYRIDIUM) 200 MG tablet Take 1 tablet (200 mg total) by mouth 3 (three) times daily as needed for pain. 04/27/23   Crist Fat, MD  pravastatin (PRAVACHOL) 20 MG tablet Take 20 mg by mouth at bedtime. 02/08/20   [provider]  sildenafil (VIAGRA) 100 MG tablet TAKE 1/2 TO 1 (ONE-HALF TO ONE) TABLET BY MOUTH AS NEEDED Patient not taking: Reported on 04/04/2023    [provider]  tamsulosin (FLOMAX) 0.4 MG CAPS capsule Take 1 capsule (0.4 mg total) by mouth daily. 04/27/23   Crist Fat, MD  traMADol (ULTRAM) 50 MG tablet Take 1-2 tablets (50-100 mg total) by mouth every 6 (six) hours as needed for moderate pain. 05/12/23   Bruning, Ashlyn, PA-C      Allergies    Patient has no known allergies.    Review of Systems   Review of Systems  Physical Exam Updated Vital Signs BP (!) 171/92   Pulse 97   Temp 97.8 F (36.6 C) (Oral)   Resp 16   Ht 5\' 11"  (1.803 m)   Wt 68 kg   SpO2 99%   BMI 20.92 kg/m  Physical Exam Vitals reviewed.  Cardiovascular:     Rate and Rhythm: Normal rate.  Pulmonary:     Effort:  Pulmonary effort is normal.  Abdominal:     General: Abdomen is flat. There is no distension.     Palpations: Abdomen is soft.  Neurological:     Mental Status: He is alert.     ED Results / Procedures / Treatments   Labs (all labs ordered are listed, but only abnormal results are displayed) Labs Reviewed  CBC WITH DIFFERENTIAL/PLATELET - Abnormal; Notable for the following components:      Result Value   WBC 11.1 (*)    RBC 3.70 (*)    Hemoglobin 10.2 (*)    HCT 30.7 (*)    Platelets 707 (*)    Neutro Abs 8.8 (*)    All other components within normal limits  BASIC METABOLIC PANEL - Abnormal; Notable for the following components:   Sodium 126 (*)    Chloride 90 (*)    Glucose, Bld 403 (*)    BUN 24 (*)    All other  components within normal limits  URINALYSIS, W/ REFLEX TO CULTURE (INFECTION SUSPECTED) - Abnormal; Notable for the following components:   APPearance CLOUDY (*)    Glucose, UA >=500 (*)    Hgb urine dipstick MODERATE (*)    Protein, ur 30 (*)    Leukocytes,Ua LARGE (*)    Bacteria, UA RARE (*)    All other components within normal limits  URINE CULTURE    EKG None  Radiology No results found.  Procedures Procedures    Medications Ordered in ED Medications  cefTRIAXone (ROCEPHIN) 1 g in sodium chloride 0.9 % 100 mL IVPB (1 g Intravenous New Bag/Given 06/06/23 0941)  oxyCODONE-acetaminophen (PERCOCET/ROXICET) 5-325 MG per tablet 1 tablet (has no administration in time range)  lactated ringers bolus 1,000 mL (1,000 mLs Intravenous New Bag/Given 06/06/23 0852)  morphine (PF) 4 MG/ML injection 4 mg (4 mg Intravenous Given 06/06/23 0902)    ED Course/ Medical Decision Making/ A&P                                 Medical Decision Making 72 year old male here today with difficulty urine and burning with urination. I reviewed the patient's radiation oncology notes.  Differential diagnoses include UTI, prostatitis, urinary retention.  Plan-will obtain urinalysis on the patient.  Will have nursing to [facility].  Patient does not appear to be systemically ill.  Reassessment-patient's urine is consistent with urinary tract infection.  Will treat.  Patient continued to retain urine, had postvoid volumes of 600.  Placed a Foley catheter, will have the patient follow-up with urology outpatient.  Amount and/or Complexity of Data Reviewed Labs: ordered.  Risk Prescription drug management.           Final Clinical Impression(s) / ED Diagnoses Final diagnoses:  Urinary retention    Rx / DC Orders ED Discharge Orders          Ordered    cephALEXin (KEFLEX) 500 MG capsule  4 times daily        06/06/23 1006              Anders Simmonds T, DO 06/06/23 1007

## 2023-06-06 NOTE — Discharge Instructions (Addendum)
While you are in the emergency department, you had a Foley catheter placed because you were retaining urine.  You can call your urologist office today, and tell them that you were seen in the emergency department for urinary retention.  They should be able to see you this week.  You also have a urinary tract infection.  You can take Keflex 4 times per day for the next 1 week.  For pain, you can take Tylenol and ibuprofen.

## 2023-06-06 NOTE — ED Notes (Signed)
Pt aware of urine specimen needed cup was given.

## 2023-06-06 NOTE — ED Triage Notes (Signed)
Pt arrived via EMS with c/o of urinary retention x 3days. 8/10 pain when urinating. Hx of UTI and prostate cancer. Patient reports cold sweats and nausea but no fever.

## 2023-06-07 ENCOUNTER — Encounter: Payer: Self-pay | Admitting: *Deleted

## 2023-06-08 ENCOUNTER — Telehealth: Payer: Self-pay

## 2023-06-08 LAB — URINE CULTURE: Culture: 30000 — AB

## 2023-06-08 NOTE — Telephone Encounter (Signed)
Spoke to patient informing him that his Colton Garcia documents had been completed and faxed back to the company. Fax conformation received. Copy of documents mailed to patient as requested. Patient had no further concerns at this time.

## 2023-06-09 ENCOUNTER — Telehealth (HOSPITAL_BASED_OUTPATIENT_CLINIC_OR_DEPARTMENT_OTHER): Payer: Self-pay

## 2023-06-09 NOTE — Telephone Encounter (Signed)
Post ED Visit - Positive Culture Follow-up  Culture report reviewed by antimicrobial stewardship pharmacist: [facility] []  Enzo Bi, Pharm.D. []  Celedonio Miyamoto, Pharm.D., BCPS AQ-ID []  Garvin Fila, Pharm.D., BCPS [x]  Georgina Pillion, [address].D., BCPS []  [place], [address].D., BCPS, AAHIVP []  Estella Husk, Pharm.D., BCPS, AAHIVP []  Lysle Pearl, PharmD, BCPS []  Phillips Climes, PharmD, BCPS []  Agapito Games, PharmD, BCPS []  Verlan Friends, PharmD []  Mervyn Gay, PharmD, BCPS []  Vinnie Level, PharmD  [facility] []  Len Childs, PharmD []  Greer Pickerel, PharmD []  Adalberto Cole, PharmD []  Perlie Gold, Rph []  Lonell Face) Jean Rosenthal, PharmD []  Earl Many, PharmD []  Junita Push, PharmD []  Dorna Leitz, PharmD []  Terrilee Files, PharmD []  Lynann Beaver, PharmD []  Keturah Barre, PharmD []  Loralee Pacas, PharmD []  Bernadene Person, PharmD   Positive urine culture Treated with Cephalexin, organism sensitive to the same and no further patient follow-up is required at this time.  Sandria Senter 06/09/2023, 11:48 AM

## 2023-06-21 ENCOUNTER — Encounter: Payer: Self-pay | Admitting: *Deleted

## 2023-07-11 ENCOUNTER — Inpatient Hospital Stay: Payer: Medicare Other | Attending: Adult Health | Admitting: *Deleted

## 2023-07-11 ENCOUNTER — Encounter: Payer: Self-pay | Admitting: *Deleted

## 2023-07-11 DIAGNOSIS — C61 Malignant neoplasm of prostate: Secondary | ICD-10-CM

## 2023-07-11 NOTE — Progress Notes (Signed)
SCP reviewed and completed. Pt will have post PSA labs in November at [facility] and f/u with Dr. Marlou Porch.

## 2023-07-18 ENCOUNTER — Telehealth: Payer: Self-pay

## 2023-07-18 NOTE — Telephone Encounter (Addendum)
Mr. Mullens left message complaints dull pain to his penis/scrotum/rectum. He received seed implant on 04/27/2023. Patient was taking Tramadol (ran out) and now takes Tylenol for pain but he reports that was noneffective. He denies chills, fever, rectal/penile bleeding, no swelling to areas, no issues with urinating. No urine retention or constipation per patient.  Advised to him to try Sitz bath, ibuprofen, preparation-H, and AZO. Reassured him that recover will take time. He's not having any drainage or foul odor from penis or rectum.  RN to inform Joyice Faster, PA-C of patient concerns.

## 2023-07-19 NOTE — Progress Notes (Signed)
RN left message with Dr. Jasmine Awe nurse at [facility] requesting follow up for residual post implant pain.    Will follow up to ensure patient is scheduled.

## 2023-07-19 NOTE — Progress Notes (Signed)
Patient is scheduled to see Dr. Marlou Porch on Friday, 11/8 to follow up with residual post implant pain. Pt aware of appointment.

## 2023-07-20 ENCOUNTER — Telehealth: Payer: Self-pay

## 2023-07-26 ENCOUNTER — Other Ambulatory Visit: Payer: Self-pay | Admitting: Urology

## 2023-07-26 DIAGNOSIS — R102 Pelvic and perineal pain: Secondary | ICD-10-CM

## 2023-07-26 DIAGNOSIS — W3400XA Accidental discharge from unspecified firearms or gun, initial encounter: Secondary | ICD-10-CM

## 2023-08-08 ENCOUNTER — Other Ambulatory Visit: Payer: Self-pay | Admitting: *Deleted

## 2023-08-08 DIAGNOSIS — I739 Peripheral vascular disease, unspecified: Secondary | ICD-10-CM

## 2023-08-22 ENCOUNTER — Encounter: Payer: Self-pay | Admitting: Surgery

## 2023-08-22 ENCOUNTER — Ambulatory Visit (HOSPITAL_COMMUNITY)
Admission: RE | Admit: 2023-08-22 | Discharge: 2023-08-22 | Disposition: A | Discharge status: Home / Self Care | Payer: Medicare Other | Source: Ambulatory Visit | Attending: Surgery | Admitting: Surgery

## 2023-08-22 ENCOUNTER — Ambulatory Visit (INDEPENDENT_AMBULATORY_CARE_PROVIDER_SITE_OTHER): Payer: Medicare Other | Admitting: Surgery

## 2023-08-22 VITALS — BP 144/76 | HR 83 | Temp 99.8°F | Resp 20 | Ht 71.0 in | Wt 155.0 lb

## 2023-08-22 DIAGNOSIS — I739 Peripheral vascular disease, unspecified: Secondary | ICD-10-CM | POA: Diagnosis present

## 2023-08-22 DIAGNOSIS — I70213 Atherosclerosis of native arteries of extremities with intermittent claudication, bilateral legs: Secondary | ICD-10-CM

## 2023-08-22 LAB — VAS US ABI WITH/WO TBI
Left ABI: 1.06
Right ABI: 1.15
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
## 2023-08-23 ENCOUNTER — Ambulatory Visit: Payer: Medicare Other | Admitting: Neurology

## 2023-08-23 ENCOUNTER — Encounter: Payer: Self-pay | Admitting: Neurology

## 2023-08-29 ENCOUNTER — Encounter: Payer: Self-pay | Admitting: *Deleted

## 2023-08-30 ENCOUNTER — Encounter: Payer: Self-pay | Admitting: *Deleted

## 2023-08-30 ENCOUNTER — Telehealth: Payer: Self-pay | Admitting: Radiation Oncology

## 2023-08-30 NOTE — Progress Notes (Signed)
Returned [employer] phone call concerning her dad, Colton Garcia. Pt will be staying with his daughter and she wanted to make sure she knew of all his upcoming appts. I was able to provide her with the next set of appts he has which are Xrays and MRI to pelvic and bilateral hips ordered for 09/12/2023 by Dr. Marlou Porch. Pt missed his post-tx PSA labs with Dr. Marlou Porch. I told daughter to call and get that rescheduled. I gave her [facility]'s phone number. Daughter was very appreciative of my help. I gave her my direct number in case she needs help with anything.

## 2023-08-30 NOTE — Telephone Encounter (Signed)
12/17 Received fax from call center stating call for general info.  Follow up call to patient, he wanted to confirm if he had any follow up appointments with Dr. Kathrynn Running.  No appointment is scheduled.  Patient is aware.

## 2023-09-12 ENCOUNTER — Ambulatory Visit
Admission: RE | Admit: 2023-09-12 | Discharge: 2023-09-12 | Disposition: A | Discharge status: Home / Self Care | Payer: Medicare Other | Source: Ambulatory Visit | Attending: Urology | Admitting: Urology

## 2023-09-12 ENCOUNTER — Other Ambulatory Visit: Payer: Self-pay | Admitting: Urology

## 2023-09-12 DIAGNOSIS — R102 Pelvic and perineal pain: Secondary | ICD-10-CM

## 2023-09-12 DIAGNOSIS — W3400XA Accidental discharge from unspecified firearms or gun, initial encounter: Secondary | ICD-10-CM

## 2023-09-12 MED ORDER — GADOPICLENOL 0.5 MMOL/ML IV SOLN
7.5000 mL | Freq: Once | INTRAVENOUS | Status: AC | PRN
  Administered 2023-09-12: 7.5 mL via INTRAVENOUS

## 2023-09-26 ENCOUNTER — Ambulatory Visit (INDEPENDENT_AMBULATORY_CARE_PROVIDER_SITE_OTHER): Payer: 59 | Admitting: Neurology

## 2023-09-26 ENCOUNTER — Encounter: Payer: Self-pay | Admitting: Neurology

## 2023-09-26 VITALS — BP 115/72 | HR 89 | Ht 71.0 in | Wt 152.0 lb

## 2023-09-26 DIAGNOSIS — G5731 Lesion of lateral popliteal nerve, right lower limb: Secondary | ICD-10-CM

## 2023-09-26 DIAGNOSIS — R202 Paresthesia of skin: Secondary | ICD-10-CM

## 2023-09-26 NOTE — Progress Notes (Signed)
 Follow-up Visit   Date: 09/26/2023    Colton Garcia MRN: [id number] DOB: 05-May-1951    Colton Garcia is a 73 y.o. right-handed African American male with  right-handed male with PAD, stage 3 CKD, hypertension, diabetes mellitus (HbA1c 7.4), hepatitis c s/p treatment, hyperlipidemia, tobacco use, COPD, prostate cancer undergoing radiation therapy and prior alcohol use returning to the clinic for follow-up of right leg pain.  The patient was accompanied to the clinic by self.  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  There is no weakness on exam and symptoms are predominately sensory.  He is no longer crossing his legs.   Bilateral feet paresthesias may be more consistent with peripheral neuropathy.  He is diabetic and has history of prior alcohol use.    I will check NCS/EMG of both legs to further evaluate these symptoms.  Discussed that medications, such as gabapentin/Cymbalta, will not help numbness, which is his primary symptom  Further recommendations pending results.   --------------------------------------------- History of present illness: Starting in December 2022, he began having numbness/tingling in the right lower leg.  Symptoms are worse in the evening.  He does not notice it very much during the day time.  He also complains of sharp pain over the knee.  He denies low back pain, weakness, or falls.  He has some imbalance and uses a cane.   He has seen cardiology and had ABI which showed mild disease, but did not feel this was causing his symptoms.  He denies similar symptoms in the left leg.    He runs a optician, dispensing at [employer].  He lives alone.   UPDATE 09/26/2023:  He is here for follow-up with ongoing numbness involving the right lower leg.  Since his last visit, he also started having numbness involving both feet.  He denies any weakness. No  radicular pain.  He uses a walking stick as needed for imbalance.  No falls. He has diabetes with last HbA1c 7.4.  He is undergoing radiation for prostate cancer.   Medications:  Current Outpatient Medications on File Prior to Visit  Medication Sig Dispense Refill   amLODipine  (NORVASC ) 10 MG tablet Take 10 mg by mouth at bedtime.      cetirizine  (ZYRTEC ) 10 MG tablet Take 10 mg by mouth at bedtime.     Elderberry-Vitamin C-Zinc (ELDERBERRY IMMUNE HEALTH GUMMY PO) Take by mouth.     glipiZIDE  (GLUCOTROL ) 5 MG tablet Take 5 mg by mouth 2 (two) times daily.     Lancets (ONETOUCH DELICA PLUS LANCET30G) MISC USE AS DIRECTED CHECK BLOOD SUGAR ONCE TO TWICE DAILY     lisinopril -hydrochlorothiazide (ZESTORETIC) 20-25 MG tablet Take 1 tablet by mouth at bedtime.     ONETOUCH VERIO test strip daily.     pravastatin  (PRAVACHOL ) 20 MG tablet Take 20 mg by mouth at bedtime.     sildenafil (VIAGRA) 100 MG tablet      tamsulosin  (FLOMAX ) 0.4 MG CAPS capsule Take 1 capsule (0.4 mg total) by mouth daily. 30 capsule 5   No current facility-administered medications on file prior to visit.    Allergies: No Known Allergies  Vital Signs:  BP 115/72   Pulse 89   Ht 5' 11 (1.803 m)   Wt 152 lb (68.9 kg)   SpO2 98%   BMI 21.20 kg/m   Neurological Exam: MENTAL STATUS including orientation to time, place,  person, recent and remote memory, attention span and concentration, language, and fund of knowledge is normal.  Speech is not dysarthric.  CRANIAL NERVES:  No visual field defects.  Pupils equal round and reactive to light.  Normal conjugate, extra-ocular eye movements in all directions of gaze.  No ptosis.  Face is symmetric. Palate elevates symmetrically.  Tongue is midline.  MOTOR:  Motor strength is 5/5 in all extremities, including distally.  No atrophy, fasciculations or abnormal movements.  No pronator drift.  Tone is normal.    MSRs:  Reflexes are 2+/4 throughout and 1+/4 at the  ankles.  SENSORY:  Vibration is reduced at the great toe bilaterally.  Hyperesthesia to pin prick bilaterally.   COORDINATION/GAIT:  Normal finger-to- nose-finger.  Intact rapid alternating movements bilaterally.  Gait narrow based and stable.   Data: NCS/EMG of the right leg 01/28/2023: Right common peroneal mononeuropathy with slowing across the fibular head, moderate. There is no evidence of a large fiber sensorimotor polyneuropathy or lumbosacral radiculopathy.  CT lumbar sine 01/07/2023: Generalized lumbar spine degeneration with moderate spinal and right foraminal stenosis at L4-5. On the symptomatic right side the L4 and L5 nerve roots could be affected at this level.  Thank you for allowing me to participate in patient's care.  If I can answer any additional questions, I would be pleased to do so.    Sincerely,    Nick Armel K. Tobie, DO

## 2023-09-26 NOTE — Patient Instructions (Signed)
ELECTROMYOGRAM AND NERVE CONDUCTION STUDIES (EMG/NCS) INSTRUCTIONS  How to Prepare The neurologist conducting the EMG will need to know if you have certain medical conditions. Tell the neurologist and other EMG lab personnel if you: . Have a pacemaker or any other electrical medical device . Take blood-thinning medications . Have hemophilia, a blood-clotting disorder that causes prolonged bleeding Bathing Take a shower or bath shortly before your exam in order to remove oils from your skin. Don't apply lotions or creams before the exam.  What to Expect You'll likely be asked to change into a hospital gown for the procedure and lie down on an examination table. The following explanations can help you understand what will happen during the exam.  . Electrodes. The neurologist or a technician places surface electrodes at various locations on your skin depending on where you're experiencing symptoms. Or the neurologist may insert needle electrodes at different sites depending on your symptoms.  . Sensations. The electrodes will at times transmit a tiny electrical current that you may feel as a twinge or spasm. The needle electrode may cause discomfort or pain that usually ends shortly after the needle is removed. If you are concerned about discomfort or pain, you may want to talk to the neurologist about taking a short break during the exam.  . Instructions. During the needle EMG, the neurologist will assess whether there is any spontaneous electrical activity when the muscle is at rest - activity that isn't present in healthy muscle tissue - and the degree of activity when you slightly contract the muscle.  He or she will give you instructions on resting and contracting a muscle at appropriate times. Depending on what muscles and nerves the neurologist is examining, he or she may ask you to change positions during the exam.  After your EMG You may experience some temporary, minor bruising where the  needle electrode was inserted into your muscle. This bruising should fade within several days. If it persists, contact your primary care doctor.    

## 2023-10-17 ENCOUNTER — Encounter: Payer: Self-pay | Admitting: *Deleted

## 2023-10-20 ENCOUNTER — Encounter: Payer: 59 | Admitting: Neurology

## 2023-10-20 ENCOUNTER — Encounter: Payer: Self-pay | Admitting: *Deleted

## 2023-11-01 ENCOUNTER — Other Ambulatory Visit: Payer: Self-pay | Admitting: Physician Assistant

## 2023-11-01 DIAGNOSIS — Z720 Tobacco use: Secondary | ICD-10-CM

## 2023-11-02 ENCOUNTER — Other Ambulatory Visit (HOSPITAL_COMMUNITY): Payer: Self-pay | Admitting: Urology

## 2023-11-07 ENCOUNTER — Other Ambulatory Visit (HOSPITAL_COMMUNITY): Payer: Self-pay | Admitting: Interventional Radiology

## 2023-11-07 DIAGNOSIS — K611 Rectal abscess: Secondary | ICD-10-CM

## 2023-11-16 ENCOUNTER — Other Ambulatory Visit: Payer: Self-pay

## 2023-11-16 ENCOUNTER — Ambulatory Visit (HOSPITAL_COMMUNITY)
Admission: RE | Admit: 2023-11-16 | Discharge: 2023-11-16 | Disposition: A | Discharge status: Home / Self Care | Payer: 59 | Source: Ambulatory Visit | Attending: Interventional Radiology

## 2023-11-16 ENCOUNTER — Encounter (HOSPITAL_COMMUNITY): Payer: Self-pay

## 2023-11-16 ENCOUNTER — Ambulatory Visit (HOSPITAL_COMMUNITY)
Admission: RE | Admit: 2023-11-16 | Discharge: 2023-11-16 | Disposition: A | Discharge status: Home / Self Care | Payer: 59 | Source: Ambulatory Visit | Attending: Interventional Radiology | Admitting: Interventional Radiology

## 2023-11-16 DIAGNOSIS — E1122 Type 2 diabetes mellitus with diabetic chronic kidney disease: Secondary | ICD-10-CM | POA: Insufficient documentation

## 2023-11-16 DIAGNOSIS — K611 Rectal abscess: Secondary | ICD-10-CM | POA: Insufficient documentation

## 2023-11-16 DIAGNOSIS — N1831 Chronic kidney disease, stage 3a: Secondary | ICD-10-CM | POA: Diagnosis not present

## 2023-11-16 DIAGNOSIS — K861 Other chronic pancreatitis: Secondary | ICD-10-CM | POA: Diagnosis not present

## 2023-11-16 DIAGNOSIS — J449 Chronic obstructive pulmonary disease, unspecified: Secondary | ICD-10-CM | POA: Diagnosis not present

## 2023-11-16 DIAGNOSIS — I129 Hypertensive chronic kidney disease with stage 1 through stage 4 chronic kidney disease, or unspecified chronic kidney disease: Secondary | ICD-10-CM | POA: Diagnosis not present

## 2023-11-16 DIAGNOSIS — E785 Hyperlipidemia, unspecified: Secondary | ICD-10-CM | POA: Diagnosis not present

## 2023-11-16 DIAGNOSIS — F17210 Nicotine dependence, cigarettes, uncomplicated: Secondary | ICD-10-CM | POA: Insufficient documentation

## 2023-11-16 DIAGNOSIS — Z7984 Long term (current) use of oral hypoglycemic drugs: Secondary | ICD-10-CM | POA: Insufficient documentation

## 2023-11-16 LAB — CBC WITH DIFFERENTIAL/PLATELET
Abs Immature Granulocytes: 0.03 10*3/uL (ref 0.00–0.07)
Basophils Absolute: 0 10*3/uL (ref 0.0–0.1)
Basophils Relative: 1 %
Eosinophils Absolute: 0.1 10*3/uL (ref 0.0–0.5)
Eosinophils Relative: 1 %
HCT: 40 % (ref 39.0–52.0)
Hemoglobin: 12.2 g/dL — ABNORMAL LOW (ref 13.0–17.0)
Immature Granulocytes: 0 %
Lymphocytes Relative: 25 %
Lymphs Abs: 1.7 10*3/uL (ref 0.7–4.0)
MCH: 26.6 pg (ref 26.0–34.0)
MCHC: 30.5 g/dL (ref 30.0–36.0)
MCV: 87.1 fL (ref 80.0–100.0)
Monocytes Absolute: 0.5 10*3/uL (ref 0.1–1.0)
Monocytes Relative: 7 %
Neutro Abs: 4.4 10*3/uL (ref 1.7–7.7)
Neutrophils Relative %: 66 %
Platelets: 268 10*3/uL (ref 150–400)
RBC: 4.59 MIL/uL (ref 4.22–5.81)
RDW: 14.9 % (ref 11.5–15.5)
WBC: 6.8 10*3/uL (ref 4.0–10.5)
nRBC: 0 % (ref 0.0–0.2)

## 2023-11-16 LAB — BASIC METABOLIC PANEL
Anion gap: 10 (ref 5–15)
BUN: 26 mg/dL — ABNORMAL HIGH (ref 8–23)
CO2: 28 mmol/L (ref 22–32)
Calcium: 10.3 mg/dL (ref 8.9–10.3)
Chloride: 105 mmol/L (ref 98–111)
Creatinine, Ser: 1.19 mg/dL (ref 0.61–1.24)
GFR, Estimated: >60 mL/min (ref >60)
Glucose, Bld: 120 mg/dL — ABNORMAL HIGH (ref 70–99)
Potassium: 4.5 mmol/L (ref 3.5–5.1)
Sodium: 143 mmol/L (ref 135–145)

## 2023-11-16 LAB — GLUCOSE, CAPILLARY: Glucose-Capillary: 127 mg/dL — ABNORMAL HIGH (ref 70–99)

## 2023-11-16 LAB — PROTIME-INR
INR: 1 (ref 0.8–1.2)
Prothrombin Time: 13.4 s (ref 11.4–15.2)

## 2023-11-16 MED ORDER — DIPHENHYDRAMINE HCL 50 MG/ML IJ SOLN
INTRAMUSCULAR | Status: AC | PRN
  Administered 2023-11-16: 50 mg via INTRAVENOUS

## 2023-11-16 MED ORDER — MIDAZOLAM HCL 2 MG/2ML IJ SOLN
INTRAMUSCULAR | Status: AC
  Filled 2023-11-16: qty 6

## 2023-11-16 MED ORDER — DIPHENHYDRAMINE HCL 50 MG/ML IJ SOLN
INTRAMUSCULAR | Status: AC
Start: 2023-11-16 — End: ?
  Filled 2023-11-16: qty 1

## 2023-11-16 MED ORDER — LIDOCAINE HCL 1 % IJ SOLN
INTRAMUSCULAR | Status: AC
  Filled 2023-11-16: qty 20

## 2023-11-16 MED ORDER — FENTANYL CITRATE (PF) 100 MCG/2ML IJ SOLN
INTRAMUSCULAR | Status: AC | PRN
  Administered 2023-11-16 (×2): 50 ug via INTRAVENOUS

## 2023-11-16 MED ORDER — MIDAZOLAM HCL 2 MG/2ML IJ SOLN
INTRAMUSCULAR | Status: AC | PRN
  Administered 2023-11-16 (×3): 1 mg via INTRAVENOUS

## 2023-11-16 MED ORDER — FENTANYL CITRATE (PF) 100 MCG/2ML IJ SOLN
INTRAMUSCULAR | Status: AC
  Filled 2023-11-16: qty 4

## 2023-11-16 MED ORDER — SODIUM CHLORIDE 0.9 % IV SOLN: INTRAVENOUS | Status: DC

## 2023-11-16 NOTE — Procedures (Signed)
 Interventional Radiology Procedure Note  Procedure: US guided aspiration of prostatic abscess yielding 7 mL purulent fluid.  Samples sent for culture.   Complications: None  Estimated Blood Loss: None.   Recommendations: - DC home   Signed,  Sterling Big, MD
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
## 2023-11-18 ENCOUNTER — Encounter: Payer: 59 | Admitting: Neurology

## 2023-11-21 LAB — AEROBIC/ANAEROBIC CULTURE W GRAM STAIN (SURGICAL/DEEP WOUND): Special Requests: NORMAL

## 2023-11-30 ENCOUNTER — Ambulatory Visit
Admission: RE | Admit: 2023-11-30 | Discharge: 2023-11-30 | Disposition: A | Discharge status: Home / Self Care | Payer: 59 | Source: Ambulatory Visit | Attending: Physician Assistant | Admitting: Physician Assistant

## 2023-11-30 DIAGNOSIS — Z720 Tobacco use: Secondary | ICD-10-CM

## 2023-12-15 ENCOUNTER — Ambulatory Visit (INDEPENDENT_AMBULATORY_CARE_PROVIDER_SITE_OTHER): Admitting: Neurology

## 2023-12-15 DIAGNOSIS — R202 Paresthesia of skin: Secondary | ICD-10-CM

## 2023-12-15 DIAGNOSIS — E1142 Type 2 diabetes mellitus with diabetic polyneuropathy: Secondary | ICD-10-CM

## 2023-12-15 DIAGNOSIS — G5731 Lesion of lateral popliteal nerve, right lower limb: Secondary | ICD-10-CM | POA: Diagnosis not present

## 2023-12-15 NOTE — Procedures (Signed)
 [facility]  [address] 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  NCV & EMG Findings: Extensive electrodiagnostic testing of the right lower extremity and additional studies of the left shows:  Bilateral sural and superficial peroneal sensory responses are absent. Right peroneal and tibial motor responses are within normal limits.  Left peroneal motor response is absent at the extensor digitorum brevis, normal at the tibialis anterior.  Left tibial motor response shows reduced amplitude. Bilateral tibial H reflex studies show prolonged latencies. Chronic motor axonal loss changes are seen affecting the anterior tibialis and flexor digitorum longus muscles bilaterally, without accompanying active denervation.  Impression: The electrophysiologic findings are consistent with a chronic and symmetric sensorimotor axonal polyneuropathy affecting the lower extremities.  Previously seen right peroneal mononeuropathy has resolved.   ___________________________ Nita Sickle, DO    Nerve Conduction Studies   Stim Site NR Peak (ms) Norm Peak (ms) O-P Amp (V) Norm O-P Amp  Left Sup Peroneal Anti Sensory (Ant Lat Mall)  32 C  12 cm *NR  <4.6  >3  Right Sup Peroneal Anti Sensory (Ant Lat Mall)  32 C  12 cm *NR  <4.6  >3  Left Sural Anti Sensory (Lat Mall)  32 C  Calf *NR  <4.6  >3  Right Sural Anti Sensory (Lat Mall)  32 C  Calf *NR  <4.6  >3     Stim Site NR Onset (ms) Norm Onset (ms) O-P Amp (mV) Norm O-P Amp Site1 Site2 Delta-0 (ms) Dist (cm) Vel (m/s) Norm Vel (m/s)  Left Peroneal Motor (Ext  Dig Brev)  32 C  Ankle *NR  <6.0  >2.5 B Fib Ankle  0.0  >40  B Fib *NR     Poplt B Fib  0.0  >40  Poplt *NR            Right Peroneal Motor (Ext Dig Brev)  32 C  Ankle    4.1 <6.0 3.6 >2.5 B Fib Ankle 9.1 38.0 42 >40  B Fib    13.2  3.6  Poplt B Fib 1.9 8.0 42 >40  Poplt    15.1  3.5         Left Peroneal TA Motor (Tib Ant)  32 C  Fib Head    3.1 <4.5 4.2 >3 Poplit Fib Head 1.6 8.0 50 >40  Poplit    4.7 <5.7 4.2         Right Peroneal TA Motor (Tib Ant)  32 C  Fib Head    4.3 <4.5 5.0 >3 Poplit Fib Head 1.4 8.0 57 >40  Poplit    5.7 <5.7 5.0         Left Tibial Motor (Abd Hall Brev)  32 C  Ankle    4.1 <6.0 *3.0 >4 Knee Ankle 9.7 44.0 45 >40  Knee    13.8  2.4         Right Tibial Motor (Abd Hall Brev)  32 C  Ankle    3.5 <6.0 7.1 >4 Knee Ankle 11.0 46.0 42 >40  Knee    14.5  4.3  Electromyography   Side Muscle Ins.Act Fibs Fasc Recrt Amp Dur Poly Activation Comment  Right AntTibialis Nml Nml Nml *2- *1+ *1+ *1+ Nml N/A  Right Gastroc Nml Nml Nml Nml Nml Nml Nml Nml N/A  Right Flex Dig Long Nml Nml Nml *1- *1+ *1+ *1+ Nml N/A  Right RectFemoris Nml Nml Nml Nml Nml Nml Nml Nml N/A  Right BicepsFemS Nml Nml Nml Nml Nml Nml Nml Nml N/A  Right GluteusMed Nml Nml Nml Nml Nml Nml Nml Nml N/A  Left AntTibialis Nml Nml Nml *1- *1+ *1+ *1+ Nml N/A  Left Gastroc Nml Nml Nml Nml Nml Nml Nml Nml N/A  Left Flex Dig Long Nml Nml Nml *1- *1+ *1+ *1+ Nml N/A  Left RectFemoris Nml Nml Nml Nml Nml Nml Nml Nml N/A  Left GluteusMed Nml Nml Nml Nml Nml Nml Nml Nml N/A      Waveforms:

## 2024-03-15 NOTE — Addendum Note (Signed)
 Encounter addended by: Jshaun Abernathy, PA-C on: 03/15/2024 4:07 PM  Actions taken: Clinical Note Signed

## 2024-10-01 ENCOUNTER — Ambulatory Visit: Payer: 59 | Admitting: Neurology

## 2024-10-03 ENCOUNTER — Encounter: Payer: Self-pay | Admitting: Neurology

## 2024-10-03 ENCOUNTER — Ambulatory Visit (INDEPENDENT_AMBULATORY_CARE_PROVIDER_SITE_OTHER): Admitting: Neurology

## 2024-10-03 VITALS — BP 102/66 | HR 64 | Ht 71.0 in | Wt 170.0 lb

## 2024-10-03 DIAGNOSIS — R292 Abnormal reflex: Secondary | ICD-10-CM

## 2024-10-03 DIAGNOSIS — E1142 Type 2 diabetes mellitus with diabetic polyneuropathy: Secondary | ICD-10-CM | POA: Diagnosis not present

## 2024-10-03 NOTE — Patient Instructions (Addendum)
 We can restart gabapentin 100mg  daily, if okay by your urologist.  MRI lumbar spine

## 2024-10-03 NOTE — Progress Notes (Signed)
 "   Follow-up Visit   Date: 10/03/2024    Colton Garcia MRN: [id number] DOB: November 30, 1950    Colton Garcia is a 74 y.o. right-handed African American male with  right-handed male with PAD, stage 3 CKD, hypertension, diabetes mellitus (HbA1c 7.4), hepatitis c s/p treatment, hyperlipidemia, tobacco use, COPD, prostate cancer s/p radiation therapy and prior alcohol use returning to the clinic for follow-up of neuropathy.  The patient was accompanied to the clinic by self.  IMPRESSION/PLAN: Assessment & Plan Polyneuropathy, risk factors: diabetes, history of alcohol use.  Gabapentin discontinued by oncologist without clear reason. Diabetes well-controlled with A1c under 6. - Patient scheduled for visit with oncologist next week.  Recommend discussing if it would be okay to start gabapentin 100mg  daily.  - Continue current management strategies, including walking a mile daily.  2.   Lumbar spinal stenosis.  Moderate lumbar spinal stenosis at L4-5 on CT from 2024 contributing to leg discomfort. Prefers self-directed exercises. - MRI lumbar spine wo contrast   3.   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  There is no longer foot drop.   Return to clinic in 6 months --------------------------------------------- History of present illness: Starting in December 2022, he began having numbness/tingling in the right lower leg.  Symptoms are worse in the evening.  He does not notice it very much during the day time.  He also complains of sharp pain over the knee.  He denies low back pain, weakness, or falls.  He has some imbalance and uses a cane.   He has seen cardiology and had ABI which showed mild disease, but did not feel this was causing his symptoms.  He denies similar symptoms in the left leg.    He runs a optician, dispensing at [employer].  He lives alone.   UPDATE 09/26/2023:  He is here for follow-up with ongoing numbness involving the right lower leg.  Since his last  visit, he also started having numbness involving both feet.  He denies any weakness. No radicular pain.  He uses a walking stick as needed for imbalance.  No falls. He has diabetes with last HbA1c 7.4.  He is undergoing radiation for prostate cancer.   UPDATE 10/03/2024:   Discussed the use of AI scribe software for clinical note transcription with the patient, who gave verbal consent to proceed.  History of Present Illness He experiences persistent numbness and tingling in his leg, describing it as throbbing and feeling as if it is swelling. These symptoms have been stable over the past year. He has difficulty sleeping at night due to the throbbing sensation and sometimes experiences tingling and burning.  He has diabetes with an A1c under six. He has fallen twice, attributing the falls to medication effects, and occasionally uses a cane for ambulation.  He has a history of neuropathy in his feet. He was previously on gabapentin, which was discontinued without noted improvement. He continues to experience burning sensations at night, affecting his sleep and requiring frequent position changes.  He has spinal stenosis in his lower back. He previously engaged in physical therapy and performed exercises independently while working, but has not continued since stopping work a year ago due to illness from radiation therapy.   Medications:  Current Outpatient Medications on File Prior to Visit  Medication Sig Dispense Refill   amLODipine (NORVASC) 10 MG tablet Take 10 mg by mouth at bedtime.      cetirizine (ZYRTEC) 10 MG tablet Take 10 mg  by mouth at bedtime.     glipiZIDE (GLUCOTROL) 5 MG tablet Take 5 mg by mouth 2 (two) times daily.     Lancets (ONETOUCH DELICA PLUS LANCET30G) MISC USE AS DIRECTED CHECK BLOOD SUGAR ONCE TO TWICE DAILY     lisinopril-hydrochlorothiazide (ZESTORETIC) 20-25 MG tablet Take 1 tablet by mouth at bedtime.     ONETOUCH VERIO test strip daily.     pravastatin  (PRAVACHOL) 20 MG tablet Take 20 mg by mouth at bedtime.     sildenafil (VIAGRA) 100 MG tablet      tamsulosin  (FLOMAX ) 0.4 MG CAPS capsule Take 1 capsule (0.4 mg total) by mouth daily. 30 capsule 5   No current facility-administered medications on file prior to visit.    Allergies: No Known Allergies  Vital Signs:  BP 102/66   Pulse 64   Ht 5' 11 (1.803 m)   Wt 170 lb (77.1 kg)   SpO2 100%   BMI 23.71 kg/m   Neurological Exam: MENTAL STATUS including orientation to time, place, person, recent and remote memory, attention span and concentration, language, and fund of knowledge is normal.  Speech is not dysarthric.  CRANIAL NERVES:   Pupils equal round and reactive to light.  Normal conjugate, extra-ocular eye movements in all directions of gaze.  No ptosis.  Face is symmetric.  MOTOR:  Motor strength is 5/5 in all extremities, including distally.  No atrophy, fasciculations or abnormal movements.  No pronator drift.  Tone is normal.    MSRs:                                           Right        Left brachioradialis 2+  2+  biceps 2+  2+  triceps 2+  2+  patellar 3+  3+  ankle jerk 1+  1+  plantar response down  down  Crossed adductors present bilaterally.  SENSORY:  Vibration is reduced at the great toe bilaterally.   COORDINATION/GAIT:  Normal finger-to- nose-finger.  Intact rapid alternating movements bilaterally.  Gait narrow based and stable.   Data: NCS/EMG of the right leg 01/28/2023: Right common peroneal mononeuropathy with slowing across the fibular head, moderate. There is no evidence of a large fiber sensorimotor polyneuropathy or lumbosacral radiculopathy.  CT lumbar sine 01/07/2023: Generalized lumbar spine degeneration with moderate spinal and right foraminal stenosis at L4-5. On the symptomatic right side the L4 and L5 nerve roots could be affected at this level.  NCS/EMG of the bilateral legs 12/15/2023: The electrophysiologic findings are consistent  with a chronic and symmetric sensorimotor axonal polyneuropathy affecting the lower extremities.  Previously seen right peroneal mononeuropathy has resolved.   Thank you for allowing me to participate in patient's care.  If I can answer any additional questions, I would be pleased to do so.    Sincerely,    Ariellah Faust K. Tobie, DO   "

## 2024-10-23 ENCOUNTER — Other Ambulatory Visit

## 2024-11-04 ENCOUNTER — Other Ambulatory Visit

## 2025-04-08 ENCOUNTER — Ambulatory Visit: Payer: Self-pay | Admitting: Neurology
